# Patient Record
Sex: Female | Born: 1980 | Race: Black or African American | Hispanic: No | Marital: Single | Smoking: Never smoker
Health system: Southern US, Community
[De-identification: ages and names within clinical notes are randomized; demographics above are authoritative.]

## PROBLEM LIST (undated history)

## (undated) ENCOUNTER — Inpatient Hospital Stay (HOSPITAL_COMMUNITY): Payer: Self-pay

## (undated) DIAGNOSIS — F329 Major depressive disorder, single episode, unspecified: Secondary | ICD-10-CM

## (undated) DIAGNOSIS — F32A Depression, unspecified: Secondary | ICD-10-CM

## (undated) DIAGNOSIS — Z8742 Personal history of other diseases of the female genital tract: Secondary | ICD-10-CM

## (undated) DIAGNOSIS — D219 Benign neoplasm of connective and other soft tissue, unspecified: Secondary | ICD-10-CM

## (undated) DIAGNOSIS — Q5128 Other doubling of uterus, other specified: Secondary | ICD-10-CM

## (undated) DIAGNOSIS — Q512 Other doubling of uterus, unspecified: Secondary | ICD-10-CM

## (undated) DIAGNOSIS — R87629 Unspecified abnormal cytological findings in specimens from vagina: Secondary | ICD-10-CM

## (undated) DIAGNOSIS — K9049 Malabsorption due to intolerance, not elsewhere classified: Secondary | ICD-10-CM

## (undated) DIAGNOSIS — B009 Herpesviral infection, unspecified: Secondary | ICD-10-CM

## (undated) DIAGNOSIS — N83209 Unspecified ovarian cyst, unspecified side: Secondary | ICD-10-CM

## (undated) DIAGNOSIS — D563 Thalassemia minor: Secondary | ICD-10-CM

## (undated) DIAGNOSIS — G4700 Insomnia, unspecified: Secondary | ICD-10-CM

## (undated) HISTORY — DX: Herpesviral infection, unspecified: B00.9

## (undated) HISTORY — PX: DILATION AND CURETTAGE OF UTERUS: SHX78

## (undated) HISTORY — DX: Thalassemia minor: D56.3

---

## 2008-09-10 ENCOUNTER — Other Ambulatory Visit: Admission: RE | Admit: 2008-09-10 | Discharge: 2008-09-10 | Payer: Self-pay | Admitting: Obstetrics and Gynecology

## 2009-01-04 ENCOUNTER — Emergency Department (HOSPITAL_COMMUNITY): Admission: EM | Admit: 2009-01-04 | Discharge: 2009-01-04 | Payer: Self-pay | Admitting: Emergency Medicine

## 2009-03-29 ENCOUNTER — Emergency Department (HOSPITAL_COMMUNITY): Admission: EM | Admit: 2009-03-29 | Discharge: 2009-03-29 | Payer: Self-pay | Admitting: Family Medicine

## 2009-11-25 ENCOUNTER — Encounter: Admission: RE | Admit: 2009-11-25 | Discharge: 2009-11-25 | Payer: Self-pay | Admitting: Internal Medicine

## 2010-04-11 LAB — POCT RAPID STREP A (OFFICE): Streptococcus, Group A Screen (Direct): POSITIVE — AB

## 2011-05-25 ENCOUNTER — Other Ambulatory Visit (HOSPITAL_COMMUNITY)
Admission: RE | Admit: 2011-05-25 | Discharge: 2011-05-25 | Disposition: A | Discharge status: Home / Self Care | Payer: 59 | Source: Ambulatory Visit | Attending: Obstetrics and Gynecology | Admitting: Obstetrics and Gynecology

## 2011-05-25 ENCOUNTER — Other Ambulatory Visit: Payer: Self-pay | Admitting: Obstetrics and Gynecology

## 2011-05-25 DIAGNOSIS — N760 Acute vaginitis: Secondary | ICD-10-CM | POA: Insufficient documentation

## 2011-05-25 DIAGNOSIS — Z113 Encounter for screening for infections with a predominantly sexual mode of transmission: Secondary | ICD-10-CM | POA: Insufficient documentation

## 2011-05-25 DIAGNOSIS — Z01419 Encounter for gynecological examination (general) (routine) without abnormal findings: Secondary | ICD-10-CM | POA: Insufficient documentation

## 2011-09-30 ENCOUNTER — Emergency Department (HOSPITAL_COMMUNITY)
Admission: EM | Admit: 2011-09-30 | Discharge: 2011-09-30 | Disposition: A | Discharge status: Home / Self Care | Payer: No Typology Code available for payment source | Attending: Emergency Medicine | Admitting: Emergency Medicine

## 2011-09-30 ENCOUNTER — Encounter (HOSPITAL_COMMUNITY): Payer: Self-pay | Admitting: Emergency Medicine

## 2011-09-30 DIAGNOSIS — IMO0002 Reserved for concepts with insufficient information to code with codable children: Secondary | ICD-10-CM

## 2011-09-30 DIAGNOSIS — S139XXA Sprain of joints and ligaments of unspecified parts of neck, initial encounter: Secondary | ICD-10-CM | POA: Insufficient documentation

## 2011-09-30 DIAGNOSIS — R51 Headache: Secondary | ICD-10-CM

## 2011-09-30 DIAGNOSIS — Y998 Other external cause status: Secondary | ICD-10-CM | POA: Insufficient documentation

## 2011-09-30 DIAGNOSIS — Y93I9 Activity, other involving external motion: Secondary | ICD-10-CM | POA: Insufficient documentation

## 2011-09-30 DIAGNOSIS — Y9289 Other specified places as the place of occurrence of the external cause: Secondary | ICD-10-CM | POA: Insufficient documentation

## 2011-09-30 MED ORDER — CYCLOBENZAPRINE HCL 10 MG PO TABS: 10.0000 mg | ORAL_TABLET | Freq: Two times a day (BID) | ORAL | Status: DC | PRN

## 2011-09-30 MED ORDER — IBUPROFEN 800 MG PO TABS: 800.0000 mg | ORAL_TABLET | Freq: Three times a day (TID) | ORAL | Status: DC

## 2011-09-30 NOTE — ED Provider Notes (Signed)
Medical screening examination/treatment/procedure(s) were performed by non-physician practitioner and as supervising physician I was immediately available for consultation/collaboration.    Maryetta Shafer R Jemel Ono, MD 09/30/11 1633 
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
## 2012-05-20 ENCOUNTER — Other Ambulatory Visit: Payer: Self-pay | Admitting: Obstetrics and Gynecology

## 2012-05-20 ENCOUNTER — Other Ambulatory Visit (HOSPITAL_COMMUNITY)
Admission: RE | Admit: 2012-05-20 | Discharge: 2012-05-20 | Disposition: A | Discharge status: Home / Self Care | Payer: 59 | Source: Ambulatory Visit | Attending: Obstetrics and Gynecology | Admitting: Obstetrics and Gynecology

## 2012-05-20 DIAGNOSIS — Z01419 Encounter for gynecological examination (general) (routine) without abnormal findings: Secondary | ICD-10-CM | POA: Insufficient documentation

## 2012-05-20 DIAGNOSIS — Z1151 Encounter for screening for human papillomavirus (HPV): Secondary | ICD-10-CM | POA: Insufficient documentation

## 2012-05-20 DIAGNOSIS — Z113 Encounter for screening for infections with a predominantly sexual mode of transmission: Secondary | ICD-10-CM | POA: Insufficient documentation

## 2013-02-19 ENCOUNTER — Encounter (HOSPITAL_COMMUNITY): Payer: Self-pay

## 2013-02-19 ENCOUNTER — Inpatient Hospital Stay (HOSPITAL_COMMUNITY)
Admission: AD | Admit: 2013-02-19 | Discharge: 2013-02-19 | Disposition: A | Discharge status: Home / Self Care | Payer: Medicaid Other | Source: Ambulatory Visit | Attending: Obstetrics & Gynecology | Admitting: Obstetrics & Gynecology

## 2013-02-19 DIAGNOSIS — Z3201 Encounter for pregnancy test, result positive: Secondary | ICD-10-CM

## 2013-02-19 LAB — POCT PREGNANCY, URINE: PREG TEST UR: POSITIVE — AB

## 2013-02-19 NOTE — [facility]U Provider Note (Signed)
S: 33 y.o. G1P0 @[redacted]w[redacted]d  by LMP presents to [facility]U for pregnancy verification. She had positive pregnancy test at home.  She denies pain or vaginal bleeding today.    No current facility-administered medications on file prior to encounter.   Current Outpatient Prescriptions on File Prior to Encounter  Medication Sig Dispense Refill  . cyclobenzaprine (FLEXERIL) 10 MG tablet Take 1 tablet (10 mg total) by mouth 2 (two) times daily as needed for muscle spasms.  20 tablet  0   Past Medical History  Diagnosis Date  . Medical history non-contributory    O: BP 117/69  Pulse 90  Resp 16  Ht 5' 1.5" (1.562 m)  Wt 114 lb 3.2 oz (51.801 kg)  BMI 21.23 kg/m2  SpO2 100%  LMP 01/25/2013  Results for orders placed during the hospital encounter of 02/19/13 (from the past 168 hour(s))  POCT PREGNANCY, URINE   Collection Time    02/19/13 12:20 PM      Result Value Ref Range   Preg Test, Ur POSITIVE (*) NEGATIVE    A: Positive pregnancy test  P: D/C home Pregnancy verification letter given F/U with early prenatal care as planned   Fatima Blank Certified Nurse-Midwife

## 2013-02-19 NOTE — [facility]U Note (Signed)
Patient states she has had a positive home pregnancy test and wants confirmation. Denies pain, bleeding, discharge, nausea, vomiting or S/S of the flu.

## 2013-02-19 NOTE — Discharge Instructions (Signed)
Pregnancy - First Trimester  During sexual intercourse, millions of sperm go into the vagina. Only 1 sperm will penetrate and fertilize the female egg while it is in the Fallopian tube. One week later, the fertilized egg implants into the wall of the uterus. An embryo begins to develop into a baby. At 6 to 8 weeks, the eyes and face are formed and the heartbeat can be seen on ultrasound. At the end of 12 weeks (first trimester), all the baby's organs are formed. Now that you are pregnant, you will want to do everything you can to have a healthy baby. Two of the most important things are to get good prenatal care and follow your caregiver's instructions. Prenatal care is all the medical care you receive before the baby's birth. It is given to prevent, find, and treat problems during the pregnancy and childbirth.  PRENATAL EXAMS  · During prenatal visits, your weight, blood pressure, and urine are checked. This is done to make sure you are healthy and progressing normally during the pregnancy.  · A pregnant woman should gain 25 to 35 pounds during the pregnancy. However, if you are overweight or underweight, your caregiver will advise you regarding your weight.  · Your caregiver will ask and answer questions for you.  · Blood work, cervical cultures, other necessary tests, and a Pap test are done during your prenatal exams. These tests are done to check on your health and the probable health of your baby. Tests are strongly recommended and done for HIV with your permission. This is the virus that causes AIDS. These tests are done because medicines can be given to help prevent your baby from being born with this infection should you have been infected without knowing it. Blood work is also used to find out your blood type, previous infections, and follow your blood levels (hemoglobin).  · Low hemoglobin (anemia) is common during pregnancy. Iron and vitamins are given to help prevent this. Later in the pregnancy, blood  tests for diabetes will be done along with any other tests if any problems develop.  · You may need other tests to make sure you and the baby are doing well.  CHANGES DURING THE FIRST TRIMESTER   Your body goes through many changes during pregnancy. They vary from person to person. Talk to your caregiver about changes you notice and are concerned about. Changes can include:  · Your menstrual period stops.  · The egg and sperm carry the genes that determine what you look like. Genes from you and your partner are forming a baby. The female genes determine whether the baby is a boy or a girl.  · Your body increases in girth and you may feel bloated.  · Feeling sick to your stomach (nauseous) and throwing up (vomiting). If the vomiting is uncontrollable, call your caregiver.  · Your breasts will begin to enlarge and become tender.  · Your nipples may stick out more and become darker.  · The need to urinate more. Painful urination may mean you have a bladder infection.  · Tiring easily.  · Loss of appetite.  · Cravings for certain kinds of food.  · At first, you may gain or lose a couple of pounds.  · You may have changes in your emotions from day to day (excited to be pregnant or concerned something may go wrong with the pregnancy and baby).  · You may have more vivid and strange dreams.  HOME CARE INSTRUCTIONS   ·   It is very important to avoid all smoking, alcohol and non-prescribed drugs during your pregnancy. These affect the formation and growth of the baby. Avoid chemicals while pregnant to ensure the delivery of a healthy infant.  · Start your prenatal visits by the 12th week of pregnancy. They are usually scheduled monthly at first, then more often in the last 2 months before delivery. Keep your caregiver's appointments. Follow your caregiver's instructions regarding medicine use, blood and lab tests, exercise, and diet.  · During pregnancy, you are providing food for you and your baby. Eat regular, well-balanced  meals. Choose foods such as meat, fish, milk and other low fat dairy products, vegetables, fruits, and whole-grain breads and cereals. Your caregiver will tell you of the ideal weight gain.  · You can help morning sickness by keeping soda crackers at the bedside. Eat a couple before arising in the morning. You may want to use the crackers without salt on them.  · Eating 4 to 5 small meals rather than 3 large meals a day also may help the nausea and vomiting.  · Drinking liquids between meals instead of during meals also seems to help nausea and vomiting.  · A physical sexual relationship may be continued throughout pregnancy if there are no other problems. Problems may be early (premature) leaking of amniotic fluid from the membranes, vaginal bleeding, or belly (abdominal) pain.  · Exercise regularly if there are no restrictions. Check with your caregiver or physical therapist if you are unsure of the safety of some of your exercises. Greater weight gain will occur in the last 2 trimesters of pregnancy. Exercising will help:  · Control your weight.  · Keep you in shape.  · Prepare you for labor and delivery.  · Help you lose your pregnancy weight after you deliver your baby.  · Wear a good support or jogging bra for breast tenderness during pregnancy. This may help if worn during sleep too.  · Ask when prenatal classes are available. Begin classes when they are offered.  · Do not use hot tubs, steam rooms, or saunas.  · Wear your seat belt when driving. This protects you and your baby if you are in an accident.  · Avoid raw meat, uncooked cheese, cat litter boxes, and soil used by cats throughout the pregnancy. These carry germs that can cause birth defects in the baby.  · The first trimester is a good time to visit your dentist for your dental health. Getting your teeth cleaned is okay. Use a softer toothbrush and brush gently during pregnancy.  · Ask for help if you have financial, counseling, or nutritional needs  during pregnancy. Your caregiver will be able to offer counseling for these needs as well as refer you for other special needs.  · Do not take any medicines or herbs unless told by your caregiver.  · Inform your caregiver if there is any mental or physical domestic violence.  · Make a list of emergency phone numbers of family, friends, hospital, and police and fire departments.  · Write down your questions. Take them to your prenatal visit.  · Do not douche.  · Do not cross your legs.  · If you have to stand for long periods of time, rotate you feet or take small steps in a circle.  · You may have more vaginal secretions that may require a sanitary pad. Do not use tampons or scented sanitary pads.  MEDICINES AND DRUG USE IN PREGNANCY  ·   Take prenatal vitamins as directed. The vitamin should contain 1 milligram of folic acid. Keep all vitamins out of reach of children. Only a couple vitamins or tablets containing iron may be fatal to a baby or young child when ingested.  · Avoid use of all medicines, including herbs, over-the-counter medicines, not prescribed or suggested by your caregiver. Only take over-the-counter or prescription medicines for pain, discomfort, or fever as directed by your caregiver. Do not use aspirin, ibuprofen, or naproxen unless directed by your caregiver.  · Let your caregiver also know about herbs you may be using.  · Alcohol is related to a number of birth defects. This includes fetal alcohol syndrome. All alcohol, in any form, should be avoided completely. Smoking will cause low birth rate and premature babies.  · Street or illegal drugs are very harmful to the baby. They are absolutely forbidden. A baby born to an addicted mother will be addicted at birth. The baby will go through the same withdrawal an adult does.  · Let your caregiver know about any medicines that you have to take and for what reason you take them.  SEEK MEDICAL CARE IF:   You have any concerns or worries during your  pregnancy. It is better to call with your questions if you feel they cannot wait, rather than worry about them.  SEEK IMMEDIATE MEDICAL CARE IF:   · An unexplained oral temperature above 102° F (38.9° C) develops, or as your caregiver suggests.  · You have leaking of fluid from the vagina (birth canal). If leaking membranes are suspected, take your temperature and inform your caregiver of this when you call.  · There is vaginal spotting or bleeding. Notify your caregiver of the amount and how many pads are used.  · You develop a bad smelling vaginal discharge with a change in the color.  · You continue to feel sick to your stomach (nauseated) and have no relief from remedies suggested. You vomit blood or coffee ground-like materials.  · You lose more than 2 pounds of weight in 1 week.  · You gain more than 2 pounds of weight in 1 week and you notice swelling of your face, hands, feet, or legs.  · You gain 5 pounds or more in 1 week (even if you do not have swelling of your hands, face, legs, or feet).  · You get exposed to German measles and have never had them.  · You are exposed to fifth disease or chickenpox.  · You develop belly (abdominal) pain. Round ligament discomfort is a common non-cancerous (benign) cause of abdominal pain in pregnancy. Your caregiver still must evaluate this.  · You develop headache, fever, diarrhea, pain with urination, or shortness of breath.  · You fall or are in a car accident or have any kind of trauma.  · There is mental or physical violence in your home.  Document Released: 12/20/2000 Document Revised: 09/20/2011 Document Reviewed: 06/23/2008  [employer]® Patient Information ©2014 [employer].

## 2013-02-24 NOTE — [facility]U Provider Note (Signed)
Attestation of Attending Supervision of Advanced Practitioner (CNM/NP): Evaluation and management procedures were performed by the Advanced Practitioner under my supervision and collaboration.  I have reviewed the Advanced Practitioner's note and chart, and I agree with the management and plan.  HARRAWAY-SMITH, Avanish Cerullo 11:26 AM

## 2013-03-20 ENCOUNTER — Ambulatory Visit (INDEPENDENT_AMBULATORY_CARE_PROVIDER_SITE_OTHER): Payer: Medicaid Other | Admitting: Obstetrics

## 2013-03-20 ENCOUNTER — Encounter: Payer: Self-pay | Admitting: Obstetrics

## 2013-03-20 DIAGNOSIS — Z3400 Encounter for supervision of normal first pregnancy, unspecified trimester: Secondary | ICD-10-CM

## 2013-03-20 DIAGNOSIS — Z3201 Encounter for pregnancy test, result positive: Secondary | ICD-10-CM

## 2013-03-20 DIAGNOSIS — Z124 Encounter for screening for malignant neoplasm of cervix: Secondary | ICD-10-CM

## 2013-03-20 DIAGNOSIS — Z113 Encounter for screening for infections with a predominantly sexual mode of transmission: Secondary | ICD-10-CM

## 2013-03-20 LAB — POCT URINALYSIS DIPSTICK
Bilirubin, UA: NEGATIVE
Glucose, UA: NEGATIVE
Ketones, UA: NEGATIVE
NITRITE UA: NEGATIVE
PH UA: 8
PROTEIN UA: NEGATIVE
Spec Grav, UA: 1.015
Urobilinogen, UA: NEGATIVE

## 2013-03-20 NOTE — Progress Notes (Signed)
Subjective:    Michele Norman is being seen today for her first obstetrical visit.  This is a planned pregnancy. She is at [redacted]w[redacted]d gestation. Her obstetrical history is significant for none. Relationship with FOB: significant other, living together. Patient does intend to breast feed. Pt states that she has been having some spotting.  Was seen at [facility] and was given pregnancy results.  Pt recently in [place] and was seen by physician for this as well and labs and u/s performed.  Pt states that she was told u/s was fine. Pregnancy history fully reviewed.  Menstrual History: OB History   Grav Para Term Preterm Abortions TAB SAB Ect Mult Living   3    2 1 1          Menarche age: 87  Patient's last menstrual period was 01/25/2013.    The following portions of the patient's history were reviewed and updated as appropriate: allergies, current medications, past family history, past medical history, past social history, past surgical history and problem list.  Review of Systems Pertinent items are noted in HPI.    Objective:    General appearance: alert and no distress Abdomen: normal findings: soft, non-tender Pelvic: cervix normal in appearance, external genitalia normal, no adnexal masses or tenderness, no cervical motion tenderness, rectovaginal septum normal, uterus normal size, shape, and consistency and vagina normal without discharge Extremities: extremities normal, atraumatic, no cyanosis or edema    Assessment:    Pregnancy at [redacted]w[redacted]d weeks    Plan:    Initial labs drawn. Prenatal vitamins.  Counseling provided regarding continued use of seat belts, cessation of alcohol consumption, smoking or use of illicit drugs; infection precautions i.e., influenza/TDAP immunizations, toxoplasmosis,CMV, parvovirus, listeria and varicella; workplace safety, exercise during pregnancy; routine dental care, safe medications, sexual activity, hot tubs, saunas, pools, travel, caffeine use, fish and  methlymercury, potential toxins, hair treatments, varicose veins Weight gain recommendations per IOM guidelines reviewed: underweight/BMI< 18.5--> gain 28 - 40 lbs; normal weight/BMI 18.5 - 24.9--> gain 25 - 35 lbs; overweight/BMI 25 - 29.9--> gain 15 - 25 lbs; obese/BMI >30->gain  11 - 20 lbs Problem list reviewed and updated. FIRST/CF mutation testing/NIPT/QUAD SCREEN discussed: requested. Role of ultrasound in pregnancy discussed; fetal survey: requested. Amniocentesis discussed: not indicated. VBAC calculator score: VBAC consent form provided Follow up in 4 weeks. 50% of 20 min visit spent on counseling and coordination of care.

## 2013-03-21 LAB — HIV ANTIBODY (ROUTINE TESTING W REFLEX): HIV: NONREACTIVE

## 2013-03-21 LAB — OBSTETRIC PANEL
Antibody Screen: NEGATIVE
Basophils Absolute: 0 K/uL (ref 0.0–0.1)
Basophils Relative: 1 % (ref 0–1)
Eosinophils Absolute: 0.1 K/uL (ref 0.0–0.7)
Eosinophils Relative: 3 % (ref 0–5)
HCT: 40 % (ref 36.0–46.0)
Hemoglobin: 13.1 g/dL (ref 12.0–15.0)
Hepatitis B Surface Ag: NEGATIVE
Lymphocytes Relative: 26 % (ref 12–46)
Lymphs Abs: 1.2 K/uL (ref 0.7–4.0)
MCH: 27.8 pg (ref 26.0–34.0)
MCHC: 32.8 g/dL (ref 30.0–36.0)
MCV: 84.7 fL (ref 78.0–100.0)
Monocytes Absolute: 0.5 K/uL (ref 0.1–1.0)
Monocytes Relative: 11 % (ref 3–12)
Neutro Abs: 2.7 K/uL (ref 1.7–7.7)
Neutrophils Relative %: 59 % (ref 43–77)
Platelets: 172 K/uL (ref 150–400)
RBC: 4.72 MIL/uL (ref 3.87–5.11)
RDW: 12.9 % (ref 11.5–15.5)
Rh Type: POSITIVE
Rubella: 1.08 {index} — ABNORMAL HIGH (ref <0.90)
WBC: 4.6 K/uL (ref 4.0–10.5)

## 2013-03-21 LAB — GC/CHLAMYDIA PROBE AMP
CT Probe RNA: NEGATIVE
GC PROBE AMP APTIMA: NEGATIVE

## 2013-03-21 LAB — WET PREP BY MOLECULAR PROBE
Candida species: NEGATIVE
Gardnerella vaginalis: NEGATIVE
Trichomonas vaginosis: NEGATIVE

## 2013-03-21 LAB — VITAMIN D 25 HYDROXY (VIT D DEFICIENCY, FRACTURES): Vit D, 25-Hydroxy: 27 ng/mL — ABNORMAL LOW (ref 30–89)

## 2013-03-21 LAB — VARICELLA ZOSTER ANTIBODY, IGG: Varicella IgG: 1869 {index} — ABNORMAL HIGH (ref <135.00)

## 2013-03-22 LAB — CULTURE, OB URINE
COLONY COUNT: NO GROWTH
ORGANISM ID, BACTERIA: NO GROWTH

## 2013-03-24 LAB — HEMOGLOBINOPATHY EVALUATION
HEMOGLOBIN OTHER: 0 %
Hgb A2 Quant: 2.8 % (ref 2.2–3.2)
Hgb A: 96.7 % — ABNORMAL LOW (ref 96.8–97.8)
Hgb F Quant: 0.5 % (ref 0.0–2.0)
Hgb S Quant: 0 %

## 2013-03-24 LAB — PAP IG W/ RFLX HPV ASCU

## 2013-03-25 ENCOUNTER — Encounter (HOSPITAL_COMMUNITY): Payer: Self-pay | Admitting: *Deleted

## 2013-03-25 ENCOUNTER — Inpatient Hospital Stay (HOSPITAL_COMMUNITY)
Admission: AD | Admit: 2013-03-25 | Discharge: 2013-03-25 | Disposition: A | Discharge status: Home / Self Care | Payer: Medicaid Other | Source: Ambulatory Visit | Attending: Obstetrics | Admitting: Obstetrics

## 2013-03-25 ENCOUNTER — Inpatient Hospital Stay (HOSPITAL_COMMUNITY): Payer: Medicaid Other

## 2013-03-25 DIAGNOSIS — O034 Incomplete spontaneous abortion without complication: Secondary | ICD-10-CM

## 2013-03-25 DIAGNOSIS — O039 Complete or unspecified spontaneous abortion without complication: Secondary | ICD-10-CM

## 2013-03-25 LAB — URINE MICROSCOPIC-ADD ON

## 2013-03-25 LAB — URINALYSIS, ROUTINE W REFLEX MICROSCOPIC
BILIRUBIN URINE: NEGATIVE
Glucose, UA: NEGATIVE mg/dL
KETONES UR: NEGATIVE mg/dL
NITRITE: NEGATIVE
PH: 7.5 (ref 5.0–8.0)
Protein, ur: NEGATIVE mg/dL
SPECIFIC GRAVITY, URINE: 1.02 (ref 1.005–1.030)
UROBILINOGEN UA: 0.2 mg/dL (ref 0.0–1.0)

## 2013-03-25 LAB — HUMAN PAPILLOMAVIRUS, HIGH RISK: HPV DNA HIGH RISK: NOT DETECTED

## 2013-03-25 NOTE — MAU Provider Note (Signed)
Chief Complaint: Vaginal Bleeding   First Provider Initiated Contact with Patient 03/25/13 2219     SUBJECTIVE HPI: Michele Norman is a 33 y.o. G3P0020 at [redacted]w[redacted]d by LMP who presents with spotting.  Called [facility] and was instructed to come to maternity admissions for evaluation.States she had a normal ultrasound in [place] earlier this pregnancy. Was seen at [facility] 03/20/2013 for new OB appointment. Pelvic exam and cultures normal.  Blood type B positive.  Past Medical History  Diagnosis Date  . Medical history non-contributory    OB History  Gravida Para Term Preterm AB SAB TAB Ectopic Multiple Living  3    2 1 1    0    # Outcome Date GA Lbr Len/2nd Weight Sex Delivery Anes PTL Lv  3 CUR           2 TAB 2003          1 SAB              Past Surgical History  Procedure Laterality Date  . Wisdom tooth extraction    . Induced abortion     History   Social History  . Marital Status: Single    Spouse Name: N/A    Number of Children: N/A  . Years of Education: N/A   Occupational History  . Not on file.   Social History Main Topics  . Smoking status: Never Smoker   . Smokeless tobacco: Not on file  . Alcohol Use: No  . Drug Use: No  . Sexual Activity: Yes   Other Topics Concern  . Not on file   Social History Narrative  . No narrative on file   No current facility-administered medications on file prior to encounter.   Current Outpatient Prescriptions on File Prior to Encounter  Medication Sig Dispense Refill  . Prenatal Vit-Fe Fumarate-FA (MULTIVITAMIN-PRENATAL) 27-0.8 MG TABS tablet Take 1 tablet by mouth daily at 12 noon.       No Known Allergies  ROS: Pertinent items in HPI. Denies abdominal pain passage of tissue or urinary complaints.  OBJECTIVE Blood pressure 114/59, pulse 85, temperature 98.7 F (37.1 C), temperature source Oral, resp. rate 16, height 5\' 1"  (1.549 m), weight 51.982 kg (114 lb 9.6 oz), last menstrual period 01/25/2013, SpO2 99.00%. GENERAL:  Well-developed, well-nourished female in no acute distress.  HEENT: Normocephalic HEART: normal rate RESP: normal effort ABDOMEN: Soft, non-tender NEURO: Alert and oriented SPECULUM EXAM: Deferred due to patient distress.  LAB RESULTS Results for orders placed during the hospital encounter of 03/25/13 (from the past 24 hour(s))  URINALYSIS, ROUTINE W REFLEX MICROSCOPIC     Status: Abnormal   Collection Time    03/25/13  6:00 PM      Result Value Ref Range   Color, Urine YELLOW  YELLOW   APPearance CLEAR  CLEAR   Specific Gravity, Urine 1.020  1.005 - 1.030   pH 7.5  5.0 - 8.0   Glucose, UA NEGATIVE  NEGATIVE mg/dL   Hgb urine dipstick SMALL (*) NEGATIVE   Bilirubin Urine NEGATIVE  NEGATIVE   Ketones, ur NEGATIVE  NEGATIVE mg/dL   Protein, ur NEGATIVE  NEGATIVE mg/dL   Urobilinogen, UA 0.2  0.0 - 1.0 mg/dL   Nitrite NEGATIVE  NEGATIVE   Leukocytes, UA TRACE (*) NEGATIVE  URINE MICROSCOPIC-ADD ON     Status: Abnormal   Collection Time    03/25/13  6:00 PM      Result Value Ref Range  Squamous Epithelial / LPF FEW (*) RARE   WBC, UA 3-6  <3 WBC/hpf   RBC / HPF 3-6  <3 RBC/hpf   Bacteria, UA MANY (*) RARE   Urine-Other MUCOUS PRESENT      IMAGING US Ob Comp Less 14 Wks  03/25/2013   CLINICAL DATA:  Pain, spotting, pregnant, 8 weeks 3 days EGA by LMP ; no quantitative beta HCG for correlation  EXAM: OBSTETRIC <14 WK [place] AND TRANSVAGINAL OB US  TECHNIQUE: Both transabdominal and transvaginal ultrasound examinations were performed for complete evaluation of the gestation as well as the maternal uterus, adnexal regions, and pelvic cul-de-sac. Transvaginal technique was performed to assess early pregnancy.  COMPARISON:  None  FINDINGS: Intrauterine gestational sac: Visualized/normal in shape.  Yolk sac:  Present  Embryo:  Present  Cardiac Activity: Not identified  Heart Rate:  N/A bpm  CRL:   16.8  mm   8 w 1 d                  [place] EDC: 11/03/2013  Maternal uterus/adnexae:  No  subchorionic hemorrhage.  Right ovary normal size and morphology, 1.8 x 3.4 x 1.8 cm.  Left ovary normal size and morphology, 2.8 x 1.8 x 3.3 cm.  No adnexal masses or free pelvic fluid.  IMPRESSION: Single intrauterine gestation identified.  However no fetal cardiac activity is seen.  Findings meet definitive criteria for failed pregnancy.  This follows SRU consensus guidelines: Diagnostic Criteria for Nonviable Pregnancy Early in the First Trimester. Alison Stalling J Med [phone].   Electronically Signed   By: Lavonia Dana M.D.   On: 03/25/2013 20:53   US Ob Transvaginal  03/25/2013   CLINICAL DATA:  Pain, spotting, pregnant, 8 weeks 3 days EGA by LMP ; no quantitative beta HCG for correlation  EXAM: OBSTETRIC <14 WK [place] AND TRANSVAGINAL OB US  TECHNIQUE: Both transabdominal and transvaginal ultrasound examinations were performed for complete evaluation of the gestation as well as the maternal uterus, adnexal regions, and pelvic cul-de-sac. Transvaginal technique was performed to assess early pregnancy.  COMPARISON:  None  FINDINGS: Intrauterine gestational sac: Visualized/normal in shape.  Yolk sac:  Present  Embryo:  Present  Cardiac Activity: Not identified  Heart Rate:  N/A bpm  CRL:   16.8  mm   8 w 1 d                  [place] EDC: 11/03/2013  Maternal uterus/adnexae:  No subchorionic hemorrhage.  Right ovary normal size and morphology, 1.8 x 3.4 x 1.8 cm.  Left ovary normal size and morphology, 2.8 x 1.8 x 3.3 cm.  No adnexal masses or free pelvic fluid.  IMPRESSION: Single intrauterine gestation identified.  However no fetal cardiac activity is seen.  Findings meet definitive criteria for failed pregnancy.  This follows SRU consensus guidelines: Diagnostic Criteria for Nonviable Pregnancy Early in the First Trimester. Alison Stalling J Med [phone].   Electronically Signed   By: Lavonia Dana M.D.   On: 03/25/2013 20:53    MAU COURSE Patient extremely distraught upon hearing diagnosis of miscarriage. Declined  to speak with Chaplain. Patient's partner at bedside, supportive. Dr. Jodi Mourning informed of ultrasound results. Recommends expectant management for now. Have patient call office and morning to schedule a followup appointment.  ASSESSMENT 1. SAB (spontaneous abortion)   2. Incomplete miscarriage     PLAN Discharge home in stable condition per consult with Dr. Jodi Mourning. Discussed management options for incomplete miscarriage  including expectant management Cytotec or D&C. [facility]pa recommends expectant management for now. Patient agreeable. Bleeding and infection precautions. Urine culture pending. Follow-up Information   Call HARPER,CHARLES A, MD. (tomorrow morning to schedule follow-up appointment or , If symptoms worsen)    Specialty:  Obstetrics and Gynecology   Contact information:   [address] [place] [address] [phone]       Follow up with [facility]. (As needed in emergencies)    Contact information:   [id number] [place] [address] [phone]       Medication List         ibuprofen 600 MG tablet  Commonly known as:  ADVIL,MOTRIN  Take 1 tablet (600 mg total) by mouth every 6 (six) hours as needed for cramping.     multivitamin-prenatal 27-0.8 MG Tabs tablet  Take 1 tablet by mouth daily at 12 noon.       [place], [place] 03/25/2013  10:44 PM

## 2013-03-25 NOTE — MAU Note (Signed)
Pt states she had some vaginal bleeding and nose bleeding little over a week ago.  Was evaluated in hospital in [place].  Pt was told ultrasound and blood work was fine.  On Thurs pt work up with vaginal bleeding and nose bleeding. Pt was evaluated by Dr. Jodi Mourning.  Pelvix exam and cultures obtained.  Today about 1600 pt states she had bright red blood on tissue only with a small pea size clot.  Pt states she has not had to wear a sanitary pad.  Pt denies pain at this time.  Pt was advised to come to MAU when she called Dr. Jacelyn Grip office this afternoon.

## 2013-03-25 NOTE — Progress Notes (Signed)
Pt to BR and no blood on panties per NT [employer]

## 2013-03-25 NOTE — Discharge Instructions (Signed)
Incomplete Miscarriage A miscarriage is the sudden loss of an unborn baby (fetus) before the 20th week of pregnancy. In an incomplete miscarriage, parts of the fetus or placenta (afterbirth) remain in the body.  Having a miscarriage can be an emotional experience. Talk with your health care provider about any questions you may have about miscarrying, the grieving process, and your future pregnancy plans. CAUSES   Problems with the fetal chromosomes that make it impossible for the baby to develop normally. Problems with the baby's genes or chromosomes are most often the result of errors that occur by chance as the embryo divides and grows. The problems are not inherited from the parents.  Infection of the cervix or uterus.  Hormone problems.  Problems with the cervix, such as having an incompetent cervix. This is when the tissue in the cervix is not strong enough to hold the pregnancy.  Problems with the uterus, such as an abnormally shaped uterus, uterine fibroids, or congenital abnormalities.  Certain medical conditions.  Smoking, drinking alcohol, or taking illegal drugs.  Trauma. SYMPTOMS   Vaginal bleeding or spotting, with or without cramps or pain.  Pain or cramping in the abdomen or lower back.  Passing fluid, tissue, or blood clots from the vagina. DIAGNOSIS  Your health care provider will perform a physical exam. You may also have an ultrasound to confirm the miscarriage. Blood or urine tests may also be ordered. TREATMENT   Usually, a dilation and curettage (D&C) procedure is performed. During a D&C procedure, the cervix is widened (dilated) and any remaining fetal or placental tissue is gently removed from the uterus.  Antibiotic medicines are prescribed if there is an infection. Other medicines may be given to reduce the size of the uterus (contract) if there is a lot of bleeding.  If you have Rh negative blood and your baby was Rh positive, you will need an Rho(D)  immune globulin shot. This shot will protect any future baby from having Rh blood problems in future pregnancies.  You may be confined to bed rest. This means you should stay in bed and only get up to use the bathroom. HOME CARE INSTRUCTIONS   Rest as directed by your health care provider.  Restrict activity as directed by your health care provider. You may be allowed to continue light activity if curettage was not done but you require further treatment.  Keep track of the number of pads you use each day. Keep track of how soaked (saturated) they are. Record this information.  Do not  use tampons.  Do not douche or have sexual intercourse until approved by your health care provider.  Keep all follow-up appointments for re-evaluation and continuing management.  Only take over-the-counter or prescription medicines for pain, fever, or discomfort as directed by your health care provider.  Take antibiotic medicine as directed by your health care provider. Make sure you finish it even if you start to feel better. SEEK IMMEDIATE MEDICAL CARE IF:   You experience severe cramps in your stomach, back, or abdomen.  You have an unexplained temperature (make sure to record these temperatures).  You pass large clots or tissue (save these for your health care provider to inspect).  Your bleeding increases.  You become light-headed, weak, or have fainting episodes. MAKE SURE YOU:   Understand these instructions.  Will watch your condition.  Will get help right away if you are not doing well or get worse. Document Released: 12/26/2004 Document Revised: 10/16/2012 Document Reviewed: 07/25/2012  ExitCare Patient Information [address].  FACTS YOU SHOULD KNOW  WHAT IS AN EARLY PREGNANCY FAILURE? Once the egg is fertilized with the sperm and begins to develop, it attaches to the lining of the uterus. This early pregnancy tissue may not develop into an embryo (the beginning stage of a  baby). Sometimes an embryo does develop but does not continue to grow. These problems can be seen on ultrasound.   MANAGEMNT OF EARLY PREGNANCY FAILURE: About 4 out of 100 (0.25%) women will have a pregnancy loss in her lifetime.  One in five pregnancies is found to be an early pregnancy failure.  There are 3 ways to care for an early pregnancy failure:   (1) Surgery, (2) Medicine, (3) Waiting for you to pass the pregnancy on your own. The decision as to how to proceed after being diagnosed with and early pregnancy failure is an individual one.  The decision can be made only after appropriate counseling.  You need to weigh the pros and cons of the 3 choices. Then you can make the choice that works for you. SURGERY (D&E)   Procedure over in 1 day   Requires being put to sleep   Bleeding may be light   Possible problems during surgery, including injury to womb(uterus)   Care provider has more control Medicine (CYTOTEC)   The complete procedure may take days to weeks   No Surgery   Bleeding may be heavy at times   There may be drug side effects   Patient has more control Waiting   You may choose to wait, in which case your own body may complete the passing of the abnormal early pregnancy on its own in about 2-4 weeks   Your bleeding may be heavy at times   There is a small possibility that you may need surgery if the bleeding is too much or not all of the pregnancy has passed. CYTOTEC MANAGEMENT Prostaglandins (cytotec) are the most widely used drug for this purpose. They cause the uterus to cramp and contract. You will place the medicine yourself inside your vagina in the privacy of your home. Empting of the uterus should occur within 3 days but the process may continue for several weeks. The bleeding may seem heavy at times. POSSIBLE SIDE EFFECTS FROM CYTOTEC   Nausea   Vomiting   Diarrhea Fever   Chills  Hot Flashes Side effects  from the process of the early pregnancy failure include:     Cramping  Bleeding   Headaches  Dizziness RISKS: This is a low risk procedure. Less than 1 in 100 women has a complication. An incomplete passage of the early pregnancy may occur. Also, Hemorrhage (heavy bleeding) could happen.  Rarely the pregnancy will not be passed completely. Excessively heavy bleeding may occur.  Your doctor may need to perform surgery to empty the uterus (D&E). Afterwards: Everybody will feel differently after the early pregnancy completion. You may have soreness or cramps for a day or two. You may have soreness or cramps for day or two.  You may have light bleeding for up to 2 weeks. You may be as active as you feel like being. If you have any of the following problems you may call Maternity Admissions Unit at [phone].   If you have pain that does not get better  with pain medication   Bleeding that soaks through 2 thick full-sized sanitary pads in an hour   Cramps that last longer than 2 days  Foul smelling discharge   Fever above 100.4 degrees F Even if you do not have any of these symptoms, you should have a follow-up exam to make sure you are healing properly. This appointment will be made for you before you leave the hospital. Your next normal period will start again in 4-6 week after the loss. You can get pregnant soon after the loss, so use birth control right away. Finally: Make sure all your questions are answered before during and after any procedure. Follow up with medical care and family planning methods.

## 2013-03-25 NOTE — MAU Note (Signed)
Patient states she had some light spotting today that was bright red with a small clot. Denies pain.

## 2013-03-26 MED ORDER — IBUPROFEN 600 MG PO TABS: 600.0000 mg | ORAL_TABLET | Freq: Four times a day (QID) | ORAL | Status: DC | PRN

## 2013-03-28 ENCOUNTER — Telehealth: Payer: Self-pay | Admitting: *Deleted

## 2013-03-28 NOTE — Telephone Encounter (Signed)
Pt called in today to schedule f/u appt with Dr Jodi Mourning for Tuesday afternoon.  Pt was seen in hospital earlier this week for spotting.  U/S confirmed no FHR at [redacted] week gestation.   Pt advised of what to expect with SAB.  Pt very distraught as to what is happening.  Pt reassured that we are here to help in any way that we can.  Discussed options of cytotec and D&C.  Pt advised to call after hour nurse if she has any further questions and/or needs. Pt advised to keep f/u appt.

## 2013-04-01 ENCOUNTER — Encounter: Payer: Self-pay | Admitting: Advanced Practice Midwife

## 2013-04-01 ENCOUNTER — Ambulatory Visit (INDEPENDENT_AMBULATORY_CARE_PROVIDER_SITE_OTHER): Payer: Medicaid Other | Admitting: Obstetrics

## 2013-04-01 DIAGNOSIS — Z3400 Encounter for supervision of normal first pregnancy, unspecified trimester: Secondary | ICD-10-CM

## 2013-04-01 DIAGNOSIS — O021 Missed abortion: Secondary | ICD-10-CM

## 2013-04-01 NOTE — Progress Notes (Signed)
Pt is here today for f/u from visit to [facility] on 03/25/13.  Pt was seen for pain and spotting. Pt was told there was no longer a FHR by u/s.  Pt states that she was seen by physician in [place] when out of town the week before and everything looked good on u/s.  Pt has questions as to when and why this happened.  Pt was very distraught when phone call was taken on Friday 3/20.

## 2013-04-02 ENCOUNTER — Encounter: Payer: Self-pay | Admitting: Obstetrics

## 2013-04-02 NOTE — Progress Notes (Signed)
FHR absent with Doppler. Ultrasound ordered.

## 2013-04-03 ENCOUNTER — Other Ambulatory Visit: Payer: Medicaid Other

## 2013-04-03 ENCOUNTER — Other Ambulatory Visit: Payer: Self-pay | Admitting: Obstetrics

## 2013-04-03 DIAGNOSIS — O039 Complete or unspecified spontaneous abortion without complication: Secondary | ICD-10-CM

## 2013-04-03 DIAGNOSIS — O3680X0 Pregnancy with inconclusive fetal viability, not applicable or unspecified: Secondary | ICD-10-CM

## 2013-04-04 LAB — HCG, QUANTITATIVE, PREGNANCY: HCG, BETA CHAIN, QUANT, S: 6018.3 m[IU]/mL

## 2013-04-05 ENCOUNTER — Inpatient Hospital Stay (HOSPITAL_COMMUNITY): Payer: Medicaid Other

## 2013-04-05 ENCOUNTER — Inpatient Hospital Stay (HOSPITAL_COMMUNITY)
Admission: AD | Admit: 2013-04-05 | Discharge: 2013-04-05 | Disposition: A | Discharge status: Home / Self Care | Payer: Medicaid Other | Source: Ambulatory Visit | Attending: Obstetrics | Admitting: Obstetrics

## 2013-04-05 ENCOUNTER — Encounter (HOSPITAL_COMMUNITY): Payer: Self-pay | Admitting: *Deleted

## 2013-04-05 DIAGNOSIS — O039 Complete or unspecified spontaneous abortion without complication: Secondary | ICD-10-CM | POA: Insufficient documentation

## 2013-04-05 DIAGNOSIS — R109 Unspecified abdominal pain: Secondary | ICD-10-CM | POA: Insufficient documentation

## 2013-04-05 DIAGNOSIS — O021 Missed abortion: Secondary | ICD-10-CM

## 2013-04-05 LAB — CBC
HCT: 36.8 % (ref 36.0–46.0)
Hemoglobin: 12.2 g/dL (ref 12.0–15.0)
MCH: 27.7 pg (ref 26.0–34.0)
MCHC: 33.2 g/dL (ref 30.0–36.0)
MCV: 83.6 fL (ref 78.0–100.0)
PLATELETS: 143 10*3/uL — AB (ref 150–400)
RBC: 4.4 MIL/uL (ref 3.87–5.11)
RDW: 12.5 % (ref 11.5–15.5)
WBC: 5.3 10*3/uL (ref 4.0–10.5)

## 2013-04-05 LAB — HCG, QUANTITATIVE, PREGNANCY: HCG, BETA CHAIN, QUANT, S: 2900 m[IU]/mL — AB (ref <5)

## 2013-04-05 MED ORDER — OXYCODONE-ACETAMINOPHEN 5-325 MG PO TABS: 1.0000 | ORAL_TABLET | ORAL | Status: DC | PRN

## 2013-04-05 MED ORDER — OXYCODONE-ACETAMINOPHEN 5-325 MG PO TABS
2.0000 | ORAL_TABLET | Freq: Once | ORAL | Status: AC
  Administered 2013-04-05: 2 via ORAL
  Filled 2013-04-05: qty 2

## 2013-04-05 NOTE — MAU Note (Signed)
Pt reports pain for 3 days, but tonight is worse. Pt was diagnosed with a miscarriage on 3/17, but had no intervention at that time.

## 2013-04-05 NOTE — MAU Provider Note (Signed)
History     CSN: [id number]  Arrival date and time: 04/05/13 4970   First Provider Initiated Contact with Patient 04/05/13 0345      Chief Complaint  Patient presents with  . Abdominal Pain   HPI  [name] is a 33 y.o. female G3P0020 who presents with increased abdominal pain. Pt was seen here in MAU on 3/17 and was told she had a failed pregnancy; she was given ibuprofen for pain after deciding to do expectant management.  She went to Dr. Jacelyn Grip office this past Tuesday for a follow up. Pt discussed the miscarriage with Dr. Jodi Mourning including why this happened. She went back to the office on Thursday for blood work. She was told to follow up in MAU on Saturday morning for repeat blood work and the patient was told she would have an [place] in the office in the next week to confirm fetal demise. The patient came to MAU today thinking that she was going to have a D/C today.   OB History   Grav Para Term Preterm Abortions TAB SAB Ect Mult Living   3    2 1 1    0      Past Medical History  Diagnosis Date  . Medical history non-contributory     Past Surgical History  Procedure Laterality Date  . Wisdom tooth extraction    . Induced abortion      Family History  Problem Relation Age of Onset  . Thyroid disease Father   . Heart murmur Brother   . Sickle cell anemia Brother   . Stroke Maternal Grandmother     History  Substance Use Topics  . Smoking status: Never Smoker   . Smokeless tobacco: Not on file  . Alcohol Use: No    Allergies: No Known Allergies  Prescriptions prior to admission  Medication Sig Dispense Refill  . ibuprofen (ADVIL,MOTRIN) 600 MG tablet Take 1 tablet (600 mg total) by mouth every 6 (six) hours as needed for cramping.  30 tablet  1  . Prenatal Vit-Fe Fumarate-FA (MULTIVITAMIN-PRENATAL) 27-0.8 MG TABS tablet Take 1 tablet by mouth daily at 12 noon.       Results for orders placed during the hospital encounter of 04/05/13 (from the past 48  hour(s))  CBC     Status: Abnormal   Collection Time    04/05/13  4:05 AM      Result Value Ref Range   WBC 5.3  4.0 - 10.5 K/uL   RBC 4.40  3.87 - 5.11 MIL/uL   Hemoglobin 12.2  12.0 - 15.0 g/dL   HCT 36.8  36.0 - 46.0 %   MCV 83.6  78.0 - 100.0 fL   MCH 27.7  26.0 - 34.0 pg   MCHC 33.2  30.0 - 36.0 g/dL   RDW 12.5  11.5 - 15.5 %   Platelets 143 (*) 150 - 400 K/uL  HCG, QUANTITATIVE, PREGNANCY     Status: Abnormal   Collection Time    04/05/13  4:05 AM      Result Value Ref Range   hCG, Beta Chain, Quant, S 2900 (*) <5 mIU/mL   Comment:              GEST. AGE      CONC.  (mIU/mL)       <=1 WEEK        5 - 50         2 WEEKS  50 - 500         3 WEEKS       100 - 10,000         4 WEEKS     1,000 - 30,000         5 WEEKS     3,500 - 115,000       6-8 WEEKS     12,000 - 270,000        12 WEEKS     15,000 - 220,000                FEMALE AND NON-PREGNANT FEMALE:         LESS THAN 5 mIU/mL   US Ob Transvaginal  04/05/2013   CLINICAL DATA:  Viability scan.  Spontaneous abortion in progress.  EXAM: TRANSVAGINAL OB ULTRASOUND  TECHNIQUE: Transvaginal ultrasound was performed for complete evaluation of the gestation as well as the maternal uterus, adnexal regions, and pelvic cul-de-sac.  COMPARISON:  03/25/2013.  FINDINGS: Interval evacuation of the previously seen gestational sac and fetus. There is distension of the endometrial cavity in the lower uterine segment to 15 mm AP span. There is no vascularity of this tissue/material. The ovaries are symmetric in size and normal in appearance. There is trace free pelvic fluid.  IMPRESSION: Since 03/25/2013, evacuation of nonviable gestational sac and fetus. There is hematoma or nonvascular products of conception distending the lower uterine segment.   Electronically Signed   By: Jorje Guild M.D.   On: 04/05/2013 05:36    Review of Systems  Constitutional: Negative for fever and chills.  Gastrointestinal: Positive for abdominal pain  (+bilateral lower abdominal cramping ). Negative for nausea, vomiting, diarrhea and constipation.  Genitourinary: Negative for dysuria, urgency, frequency and hematuria.       No vaginal discharge. + vaginal bleeding; dark red, light No dysuria.     Physical Exam   Blood pressure 109/62, pulse 63, temperature 98.5 F (36.9 C), temperature source Oral, resp. rate 18, last menstrual period 01/25/2013.  Physical Exam  Constitutional: She appears well-developed and well-nourished. No distress.  Respiratory: Effort normal.  GI: Soft. She exhibits no distension. There is no tenderness.  Genitourinary:  Speculum exam: Vagina - Small amount of dark red blood in canal Cervix - small active bleeding, clot noted at os; unable to retreive.  Bimanual exam: Cervix FT Uterus non tender, enlarged  Adnexa non tender, no masses bilaterally Chaperone present for exam.   Skin: She is not diaphoretic.    MAU Course  Procedures None  MDM B positive blood type  Pt currently rates her pain 9-10 2 percocet given in MAU  Patient rates her pain 2/10 following percocet. Pt verbalizes that she feels better going home knowing her pain will be controlled.  Consulted with Dr. Oletha Cruel; discharge patient home with pain medication and have patient follow up in the office on Monday with Dr. Jodi Mourning.   Assessment and Plan   A:  SAB in progress Hematoma or nonvascular products of conception distending the lower uterine segment   P:  Discharge home in stable condition Call Dr. Jacelyn Grip office on Monday to schedule a follow up appointment Bleeding precautions discussed RX: Percocet Continue taking ibuprofen as prescribed Support given  Darrelyn Hillock Becky Berberian, NP 04/05/2013, 3:46 PM

## 2013-04-15 ENCOUNTER — Encounter: Payer: Medicaid Other | Admitting: Obstetrics

## 2013-04-15 ENCOUNTER — Ambulatory Visit (INDEPENDENT_AMBULATORY_CARE_PROVIDER_SITE_OTHER): Payer: Medicaid Other | Admitting: Obstetrics

## 2013-04-15 ENCOUNTER — Other Ambulatory Visit: Payer: Self-pay | Admitting: Obstetrics

## 2013-04-15 DIAGNOSIS — O021 Missed abortion: Secondary | ICD-10-CM | POA: Insufficient documentation

## 2013-04-15 LAB — CBC
HCT: 39.9 % (ref 36.0–46.0)
Hemoglobin: 13.1 g/dL (ref 12.0–15.0)
MCH: 27.3 pg (ref 26.0–34.0)
MCHC: 32.8 g/dL (ref 30.0–36.0)
MCV: 83.1 fL (ref 78.0–100.0)
Platelets: 161 10*3/uL (ref 150–400)
RBC: 4.8 MIL/uL (ref 3.87–5.11)
RDW: 13 % (ref 11.5–15.5)
WBC: 3.3 10*3/uL — ABNORMAL LOW (ref 4.0–10.5)

## 2013-04-16 ENCOUNTER — Encounter: Payer: Self-pay | Admitting: Obstetrics

## 2013-04-16 ENCOUNTER — Ambulatory Visit (HOSPITAL_COMMUNITY)
Admission: RE | Admit: 2013-04-16 | Discharge: 2013-04-16 | Disposition: A | Discharge status: Home / Self Care | Payer: Medicaid Other | Source: Ambulatory Visit | Attending: Obstetrics | Admitting: Obstetrics

## 2013-04-16 ENCOUNTER — Ambulatory Visit (INDEPENDENT_AMBULATORY_CARE_PROVIDER_SITE_OTHER): Payer: Medicaid Other | Admitting: Obstetrics

## 2013-04-16 ENCOUNTER — Inpatient Hospital Stay (HOSPITAL_COMMUNITY)
Admission: AD | Admit: 2013-04-16 | Discharge: 2013-04-17 | Disposition: A | Discharge status: Home / Self Care | Payer: Medicaid Other | Source: Ambulatory Visit | Attending: Obstetrics | Admitting: Obstetrics

## 2013-04-16 ENCOUNTER — Encounter (HOSPITAL_COMMUNITY): Payer: Self-pay | Admitting: *Deleted

## 2013-04-16 VITALS — BP 102/67 | HR 83 | Temp 98.6°F | Ht 61.5 in | Wt 113.0 lb

## 2013-04-16 DIAGNOSIS — N898 Other specified noninflammatory disorders of vagina: Secondary | ICD-10-CM | POA: Insufficient documentation

## 2013-04-16 DIAGNOSIS — R509 Fever, unspecified: Secondary | ICD-10-CM

## 2013-04-16 DIAGNOSIS — F329 Major depressive disorder, single episode, unspecified: Secondary | ICD-10-CM

## 2013-04-16 DIAGNOSIS — F3289 Other specified depressive episodes: Secondary | ICD-10-CM

## 2013-04-16 DIAGNOSIS — Z09 Encounter for follow-up examination after completed treatment for conditions other than malignant neoplasm: Secondary | ICD-10-CM | POA: Insufficient documentation

## 2013-04-16 DIAGNOSIS — O021 Missed abortion: Secondary | ICD-10-CM

## 2013-04-16 DIAGNOSIS — J101 Influenza due to other identified influenza virus with other respiratory manifestations: Secondary | ICD-10-CM

## 2013-04-16 DIAGNOSIS — R109 Unspecified abdominal pain: Secondary | ICD-10-CM | POA: Insufficient documentation

## 2013-04-16 DIAGNOSIS — J111 Influenza due to unidentified influenza virus with other respiratory manifestations: Secondary | ICD-10-CM

## 2013-04-16 DIAGNOSIS — R52 Pain, unspecified: Secondary | ICD-10-CM | POA: Insufficient documentation

## 2013-04-16 LAB — COMPREHENSIVE METABOLIC PANEL
ALBUMIN: 4 g/dL (ref 3.5–5.2)
ALK PHOS: 21 U/L — AB (ref 39–117)
ALT: 11 U/L (ref 0–35)
AST: 11 U/L (ref 0–37)
BUN: 9 mg/dL (ref 6–23)
CHLORIDE: 103 meq/L (ref 96–112)
CO2: 29 mEq/L (ref 19–32)
Calcium: 9.1 mg/dL (ref 8.4–10.5)
Creat: 0.74 mg/dL (ref 0.50–1.10)
GLUCOSE: 159 mg/dL — AB (ref 70–99)
POTASSIUM: 3.4 meq/L — AB (ref 3.5–5.3)
SODIUM: 139 meq/L (ref 135–145)
TOTAL PROTEIN: 6.3 g/dL (ref 6.0–8.3)
Total Bilirubin: 0.7 mg/dL (ref 0.2–1.2)

## 2013-04-16 LAB — URINALYSIS, ROUTINE W REFLEX MICROSCOPIC
Bilirubin Urine: NEGATIVE
GLUCOSE, UA: NEGATIVE mg/dL
KETONES UR: NEGATIVE mg/dL
Leukocytes, UA: NEGATIVE
Nitrite: NEGATIVE
PROTEIN: NEGATIVE mg/dL
Specific Gravity, Urine: 1.02 (ref 1.005–1.030)
Urobilinogen, UA: 0.2 mg/dL (ref 0.0–1.0)
pH: 8 (ref 5.0–8.0)

## 2013-04-16 LAB — HCG, QUANTITATIVE, PREGNANCY: hCG, Beta Chain, Quant, S: 49.8 m[IU]/mL

## 2013-04-16 LAB — URINE MICROSCOPIC-ADD ON

## 2013-04-16 MED ORDER — LACTATED RINGERS IV BOLUS (SEPSIS)
1000.0000 mL | Freq: Once | INTRAVENOUS | Status: AC
  Administered 2013-04-16: 1000 mL via INTRAVENOUS

## 2013-04-16 MED ORDER — IBUPROFEN 800 MG PO TABS
800.0000 mg | ORAL_TABLET | Freq: Once | ORAL | Status: AC
  Administered 2013-04-16: 800 mg via ORAL
  Filled 2013-04-16: qty 1

## 2013-04-16 MED ORDER — SERTRALINE HCL 50 MG PO TABS: 50.0000 mg | ORAL_TABLET | Freq: Every day | ORAL | Status: DC

## 2013-04-16 MED ORDER — BUTORPHANOL TARTRATE 1 MG/ML IJ SOLN
1.0000 mg | Freq: Once | INTRAMUSCULAR | Status: AC
Start: 2013-04-16 — End: 2013-04-16
  Administered 2013-04-16: 1 mg via INTRAVENOUS
  Filled 2013-04-16: qty 1

## 2013-04-16 NOTE — MAU Provider Note (Signed)
History     CSN: [id number]  Arrival date and time: 04/16/13 2022   First Provider Initiated Contact with Patient 04/16/13 2126      Chief Complaint  Patient presents with  . Generalized Body Aches  . Fever   HPI [name] is a 33 y.o. G3P0020 who recently have a miscarriage who presents to MAU today with complaint of new onset fever and body aches. The patient was seen in the office today and US showed no retained POC. The patient spiked a fever around 2000 today of 102.2 F. She did not take any medications, but came straight here. She continues to have light bleeding. She denies specific abdominal pain, just stating generalized body aches.   OB History   Grav Para Term Preterm Abortions TAB SAB Ect Mult Living   3    2 1 1    0      Past Medical History  Diagnosis Date  . Medical history non-contributory     Past Surgical History  Procedure Laterality Date  . Wisdom tooth extraction    . Induced abortion      Family History  Problem Relation Age of Onset  . Thyroid disease Father   . Heart murmur Brother   . Sickle cell anemia Brother   . Stroke Maternal Grandmother     History  Substance Use Topics  . Smoking status: Never Smoker   . Smokeless tobacco: Not on file  . Alcohol Use: Yes     Comment:  wine occasionally     Allergies: No Known Allergies  No prescriptions prior to admission    Review of Systems  Constitutional: Positive for fever and malaise/fatigue.  Gastrointestinal: Positive for abdominal pain. Negative for nausea and vomiting.  Genitourinary: Negative for dysuria, urgency and frequency.       + vaginal bleeding   Physical Exam   Blood pressure 90/38, pulse 112, temperature 99.8 F (37.7 C), temperature source Oral, resp. rate 16, last menstrual period 01/25/2013, SpO2 100.00%, unknown if currently breastfeeding.  Physical Exam  Constitutional: She is oriented to person, place, and time. She appears well-developed and  well-nourished. No distress.  HENT:  Head: Normocephalic and atraumatic.  Cardiovascular: Regular rhythm and normal heart sounds.  Tachycardia present.   Respiratory: Effort normal and breath sounds normal. No respiratory distress.  GI: Soft. She exhibits no distension and no mass. There is no tenderness. There is no rebound, no guarding and no CVA tenderness.  Genitourinary: Uterus is not enlarged and not tender. Cervix exhibits no motion tenderness, no discharge and no friability. Right adnexum displays no mass and no tenderness. Left adnexum displays no mass and no tenderness. There is bleeding (small amount of blood and one medium sized clot noted) around the vagina. No vaginal discharge found.  Neurological: She is alert and oriented to person, place, and time.  Skin: Skin is warm and dry. No erythema.  Psychiatric: She has a normal mood and affect.   Results for orders placed during the hospital encounter of 04/16/13 (from the past 24 hour(s))  URINALYSIS, ROUTINE W REFLEX MICROSCOPIC     Status: Abnormal   Collection Time    04/16/13  8:45 PM      Result Value Ref Range   Color, Urine YELLOW  YELLOW   APPearance CLEAR  CLEAR   Specific Gravity, Urine 1.020  1.005 - 1.030   pH 8.0  5.0 - 8.0   Glucose, UA NEGATIVE  NEGATIVE mg/dL  Hgb urine dipstick LARGE (*) NEGATIVE   Bilirubin Urine NEGATIVE  NEGATIVE   Ketones, ur NEGATIVE  NEGATIVE mg/dL   Protein, ur NEGATIVE  NEGATIVE mg/dL   Urobilinogen, UA 0.2  0.0 - 1.0 mg/dL   Nitrite NEGATIVE  NEGATIVE   Leukocytes, UA NEGATIVE  NEGATIVE  URINE MICROSCOPIC-ADD ON     Status: Abnormal   Collection Time    04/16/13  8:45 PM      Result Value Ref Range   Squamous Epithelial / LPF FEW (*) RARE   WBC, UA 0-2  <3 WBC/hpf   RBC / HPF 3-6  <3 RBC/hpf   Bacteria, UA RARE  RARE    MAU Course  Procedures None  MDM Discussed with Dr. Jodi Mourning. Blood cultures, flu swab, urine culture, 1 mg stadol, 1 liter LR and Ibuprofen  Patient  reports resolution of pain Temperature is now 99.4 F Continues to have no pain or fever after monitoring x > 1 hour Cultures pending Discussed with Dr. Jodi Mourning. [place] for discharge.   Assessment and Plan  A: Fever of unknown origin  P: Discharge home Ibuprofen and Percocet advised for pain and fever PRN Blood cultures, urine culture and influenza swab pending Follow-up with Jodi Mourning as scheduled or call for earlier appointment if symptoms persist or worsen Patient may return to MAU as needed or if her condition were to change or worsen  Farris Has, PA-C  04/17/2013, 4:57 AM

## 2013-04-16 NOTE — MAU Note (Signed)
Pt reports she was seen in MD's office today for follow up of failed pregnancy. Was told that she had passed the pregnancy and there was just some clot right at the cervix. Pt reports that about 1800 she started having body aches and back pain and then started running a fever. States her whole back hurts. Changed her pad twice today.

## 2013-04-16 NOTE — Progress Notes (Signed)
Subjective:     Michele Norman is a 33 y.o. female here for a routine exam.  Current complaints: Patient is in the office today for a follow up visit for ultrasound results.  The HPI was reviewed and explored in further detail by the provider. Gynecologic History Patient's last menstrual period was 01/25/2013. Contraception: abstinence Obstetric History OB History  Gravida Para Term Preterm AB SAB TAB Ectopic Multiple Living  3    2 1 1    0    # Outcome Date GA Lbr Len/2nd Weight Sex Delivery Anes PTL Lv  3 CUR           2 TAB 2003          1 SAB                The following portions of the patient's history were reviewed and updated as appropriate: allergies, current medications, past family history, past medical history, past social history, past surgical history and problem list.  Review of Systems A comprehensive review of systems was negative.    Objective:    No exam performed today, Consult only..    100 % of 10 min visit spent on counseling and coordination of care.   Assessment:    Missed Abortion.  No IUP seen on ultrasound.  Findings on ultrasound explained to patient.  Depression, situational.  Counseling done for future contraception. Plan:    Education reviewed: Pregnancy loss and management.. Follow Quantitative beta HCG's.   Zoloft Rx for Depression.

## 2013-04-17 DIAGNOSIS — R509 Fever, unspecified: Secondary | ICD-10-CM

## 2013-04-17 LAB — INFLUENZA PANEL BY PCR (TYPE A & B)
H1N1 flu by pcr: NOT DETECTED
Influenza A By PCR: POSITIVE — AB
Influenza B By PCR: NEGATIVE

## 2013-04-17 MED ORDER — OSELTAMIVIR PHOSPHATE 75 MG PO CAPS: 75.0000 mg | ORAL_CAPSULE | Freq: Two times a day (BID) | ORAL | Status: DC

## 2013-04-17 NOTE — Discharge Instructions (Signed)
Miscarriage  A miscarriage is the loss of an unborn baby (fetus) before the 20th week of pregnancy. The cause is often unknown.   HOME CARE  · You may need to stay in bed (bed rest), or you may be able to do light activity. Go about activity as told by your doctor.  · Have help at home.  · Write down how many pads you use each day. Write down how soaked they are.  · Do not use tampons. Do not wash out your vagina (douche) or have sex (intercourse) until your doctor approves.  · Only take medicine as told by your doctor.  · Do not take aspirin.  · Keep all doctor visits as told.  · If you or your partner have problems with grieving, talk to your doctor. You can also try counseling. Give yourself time to grieve before trying to get pregnant again.  GET HELP RIGHT AWAY IF:  · You have bad cramps or pain in your back or belly (abdomen).  · You have a fever.  · You pass large clumps of blood (clots) from your vagina that are walnut-sized or larger. Save the clumps for your doctor to see.  · You pass large amounts of tissue from your vagina. Save the tissue for your doctor to see.  · You have more bleeding.  · You have thick, bad-smelling fluid (discharge) coming from the vagina.  · You get lightheaded, weak, or you pass out (faint).  · You have chills.  MAKE SURE YOU:  · Understand these instructions.  · Will watch your condition.  · Will get help right away if you are not doing well or get worse.  Document Released: 03/20/2011 Document Reviewed: 03/20/2011  [employer]® Patient Information ©2014 [employer].

## 2013-04-18 ENCOUNTER — Other Ambulatory Visit: Payer: Self-pay | Admitting: Medical

## 2013-04-18 ENCOUNTER — Encounter: Payer: Self-pay | Admitting: Obstetrics

## 2013-04-18 LAB — URINE CULTURE
Colony Count: NO GROWTH
Culture: NO GROWTH

## 2013-04-18 NOTE — Progress Notes (Signed)
LM for patient to return call to MAU. Patient had + influenza. Rx for Tamiflu sent to patient's pharmacy by Dr. Jodi Mourning on 04/17/13.   Farris Has, PA-C 04/18/2013 8:26 AM

## 2013-04-22 ENCOUNTER — Ambulatory Visit: Payer: Medicaid Other | Admitting: Obstetrics

## 2013-04-23 LAB — CULTURE, BLOOD (ROUTINE X 2)
CULTURE: NO GROWTH
Culture: NO GROWTH

## 2013-04-28 ENCOUNTER — Encounter: Payer: Self-pay | Admitting: Obstetrics

## 2013-04-28 DIAGNOSIS — F3289 Other specified depressive episodes: Secondary | ICD-10-CM | POA: Insufficient documentation

## 2013-04-28 DIAGNOSIS — F329 Major depressive disorder, single episode, unspecified: Secondary | ICD-10-CM | POA: Insufficient documentation

## 2013-04-28 DIAGNOSIS — J101 Influenza due to other identified influenza virus with other respiratory manifestations: Secondary | ICD-10-CM | POA: Insufficient documentation

## 2013-04-29 ENCOUNTER — Encounter: Payer: Self-pay | Admitting: Obstetrics

## 2013-04-29 NOTE — Progress Notes (Signed)
Patient ID: Michele Norman, female   DOB: 1980-03-11, 33 y.o.   MRN: [id number]  No chief complaint on file.   HPI Michele Norman is a 33 y.o. female.  Presents for results of ultrasound for suspected SAB.  HPI  Past Medical History  Diagnosis Date  . Medical history non-contributory     Past Surgical History  Procedure Laterality Date  . Wisdom tooth extraction    . Induced abortion      Family History  Problem Relation Age of Onset  . Thyroid disease Father   . Heart murmur Brother   . Sickle cell anemia Brother   . Stroke Maternal Grandmother     Social History History  Substance Use Topics  . Smoking status: Never Smoker   . Smokeless tobacco: Not on file  . Alcohol Use: Yes     Comment:  wine occasionally     No Known Allergies  Current Outpatient Prescriptions  Medication Sig Dispense Refill  . ibuprofen (ADVIL,MOTRIN) 600 MG tablet Take 1 tablet (600 mg total) by mouth every 6 (six) hours as needed for cramping.  30 tablet  1  . oseltamivir (TAMIFLU) 75 MG capsule Take 1 capsule (75 mg total) by mouth 2 (two) times daily.  10 capsule  1  . oxyCODONE-acetaminophen (PERCOCET/ROXICET) 5-325 MG per tablet Take 1-2 tablets by mouth every 4 (four) hours as needed for severe pain.  15 tablet  0  . sertraline (ZOLOFT) 50 MG tablet Take 1 tablet (50 mg total) by mouth daily.  30 tablet  11   No current facility-administered medications for this visit.    Review of Systems Review of Systems Constitutional: negative for fatigue and weight loss Respiratory: negative for cough and wheezing Cardiovascular: negative for chest pain, fatigue and palpitations Gastrointestinal: negative for abdominal pain and change in bowel habits Genitourinary:negative Integument/breast: negative for nipple discharge Musculoskeletal:negative for myalgias Neurological: negative for gait problems and tremors Behavioral/Psych: negative for abusive relationship, depression Endocrine: negative for  temperature intolerance     Last menstrual period 01/25/2013, unknown if currently breastfeeding.  Physical Exam Physical Exam:  Deferred                       Data Reviewed Ultrasound reveals no IUP.  Assessment    Missed abortion.  Complete.  Follow  Quantitative beta HCG     Plan      Need to obtain previous records Possible management options include:Expectant.  Will follow  Follow up as needed.       [name] 04/29/2013, 9:06 AM

## 2013-05-01 ENCOUNTER — Ambulatory Visit (INDEPENDENT_AMBULATORY_CARE_PROVIDER_SITE_OTHER): Payer: Medicaid Other | Admitting: Obstetrics

## 2013-05-01 ENCOUNTER — Encounter: Payer: Self-pay | Admitting: Obstetrics

## 2013-05-01 ENCOUNTER — Encounter: Payer: Self-pay | Admitting: *Deleted

## 2013-05-01 VITALS — BP 103/74 | HR 76 | Temp 99.1°F | Ht 61.5 in | Wt 115.0 lb

## 2013-05-01 DIAGNOSIS — F329 Major depressive disorder, single episode, unspecified: Secondary | ICD-10-CM

## 2013-05-01 DIAGNOSIS — O021 Missed abortion: Secondary | ICD-10-CM

## 2013-05-01 DIAGNOSIS — F3289 Other specified depressive episodes: Secondary | ICD-10-CM

## 2013-05-02 ENCOUNTER — Encounter: Payer: Self-pay | Admitting: Obstetrics

## 2013-05-02 LAB — HCG, QUANTITATIVE, PREGNANCY: hCG, Beta Chain, Quant, S: <2 m[IU]/mL

## 2013-05-02 NOTE — Progress Notes (Signed)
Patient ID: Michele Norman, female   DOB: 08/07/1980, 33 y.o.   MRN: [id number]  Chief Complaint  Patient presents with  . Follow-up    Miscarriage    HPI Michele Norman is a 33 y.o. female.  Presents for lab results after Missed Abortion, completed.  No complaints.  HPI  Past Medical History  Diagnosis Date  . Medical history non-contributory     Past Surgical History  Procedure Laterality Date  . Wisdom tooth extraction    . Induced abortion      Family History  Problem Relation Age of Onset  . Thyroid disease Father   . Heart murmur Brother   . Sickle cell anemia Brother   . Stroke Maternal Grandmother     Social History History  Substance Use Topics  . Smoking status: Never Smoker   . Smokeless tobacco: Not on file  . Alcohol Use: Yes     Comment:  wine occasionally     No Known Allergies  Current Outpatient Prescriptions  Medication Sig Dispense Refill  . sertraline (ZOLOFT) 50 MG tablet Take 1 tablet (50 mg total) by mouth daily.  30 tablet  11   No current facility-administered medications for this visit.    Review of Systems Review of Systems Constitutional: negative for fatigue and weight loss Respiratory: negative for cough and wheezing Cardiovascular: negative for chest pain, fatigue and palpitations Gastrointestinal: negative for abdominal pain and change in bowel habits Genitourinary:negative Integument/breast: negative for nipple discharge Musculoskeletal:negative for myalgias Neurological: negative for gait problems and tremors Behavioral/Psych: positive for depression Endocrine: negative for temperature intolerance     Blood pressure 103/74, pulse 76, temperature 99.1 F (37.3 C), height 5' 1.5" (1.562 m), weight 115 lb (52.164 kg), last menstrual period 01/25/2013, unknown if currently breastfeeding.  Physical Exam Physical Exam General:   alert PE:  Deferred.  Consult only.  Data Reviewed Ultrasound reveals no IUP.  Quantitative beta HCG  down to 49 from 6000.  Assessment    Missed Abortion  Depression.    Plan      Need to obtain previous records Possible management options include:Repeat quantitative beta HCG until < 25. Follow up as needed.  Continue Zoloft for depression.  F/U in 6 weeks.        Michele Norman 05/02/2013, 1:12 AM

## 2013-06-12 ENCOUNTER — Ambulatory Visit (INDEPENDENT_AMBULATORY_CARE_PROVIDER_SITE_OTHER): Payer: Medicaid Other | Admitting: Obstetrics

## 2013-06-12 ENCOUNTER — Encounter: Payer: Self-pay | Admitting: Obstetrics

## 2013-06-12 VITALS — BP 99/64 | HR 80 | Temp 98.6°F | Ht 62.0 in | Wt 115.0 lb

## 2013-06-12 DIAGNOSIS — O039 Complete or unspecified spontaneous abortion without complication: Secondary | ICD-10-CM

## 2013-06-12 DIAGNOSIS — J309 Allergic rhinitis, unspecified: Secondary | ICD-10-CM

## 2013-06-12 DIAGNOSIS — J302 Other seasonal allergic rhinitis: Secondary | ICD-10-CM | POA: Insufficient documentation

## 2013-06-12 DIAGNOSIS — F329 Major depressive disorder, single episode, unspecified: Secondary | ICD-10-CM

## 2013-06-12 DIAGNOSIS — F3289 Other specified depressive episodes: Secondary | ICD-10-CM

## 2013-06-12 DIAGNOSIS — O021 Missed abortion: Secondary | ICD-10-CM

## 2013-06-12 DIAGNOSIS — G4700 Insomnia, unspecified: Secondary | ICD-10-CM

## 2013-06-12 MED ORDER — LORATADINE 10 MG PO TABS: 10.0000 mg | ORAL_TABLET | Freq: Every day | ORAL | Status: DC

## 2013-06-12 MED ORDER — ZOLPIDEM TARTRATE 5 MG PO TABS: 5.0000 mg | ORAL_TABLET | Freq: Every evening | ORAL | Status: DC | PRN

## 2013-06-12 NOTE — Progress Notes (Signed)
Patient ID: Michele Norman, female   DOB: 27-Jul-1980, 33 y.o.   MRN: [id number]  Chief Complaint  Patient presents with  . Follow-up    Depression     HPI Michele Norman is a 33 y.o. female.  S/P SAB in March 2015.  Depressed.  On Zoloft but stopped recently.  Also c/o insomnia.Runny nose and congestion started yesterday.  HPI  Past Medical History  Diagnosis Date  . Medical history non-contributory     Past Surgical History  Procedure Laterality Date  . Wisdom tooth extraction    . Induced abortion      Family History  Problem Relation Age of Onset  . Thyroid disease Father   . Heart murmur Brother   . Sickle cell anemia Brother   . Stroke Maternal Grandmother     Social History History  Substance Use Topics  . Smoking status: Never Smoker   . Smokeless tobacco: Not on file  . Alcohol Use: Yes     Comment:  wine occasionally     No Known Allergies  Current Outpatient Prescriptions  Medication Sig Dispense Refill  . loratadine (CLARITIN) 10 MG tablet Take 1 tablet (10 mg total) by mouth daily.  30 tablet  11  . sertraline (ZOLOFT) 50 MG tablet Take 1 tablet (50 mg total) by mouth daily.  30 tablet  11  . zolpidem (AMBIEN) 5 MG tablet Take 1 tablet (5 mg total) by mouth at bedtime as needed for sleep.  30 tablet  5   No current facility-administered medications for this visit.    Review of Systems Review of Systems Constitutional: negative for fatigue and weight loss Respiratory: negative for cough and wheezing Cardiovascular: negative for chest pain, fatigue and palpitations Gastrointestinal: negative for abdominal pain and change in bowel habits Genitourinary:negative Integument/breast: negative for nipple discharge Musculoskeletal:negative for myalgias Neurological: negative for gait problems and tremors Behavioral/Psych: negative for abusive relationship, depression Endocrine: negative for temperature intolerance     Blood pressure 99/64, pulse 80,  temperature 98.6 F (37 C), height 5\' 2"  (1.575 m), weight 115 lb (52.164 kg), last menstrual period 06/05/2013, unknown if currently breastfeeding.  Physical Exam Physical Exam:  Deferred  100% of 10 min visit spent on counseling and coordination of care.   Data Reviewed Labs Ultrasound  Assessment    SAB, completed.  Doing well. Depression.  Situational.  Unstable.   Insomnia. Seasonal allergic rhinitis.    Plan    Continue Zoloft. Ambien Rx Claritin Rx. F/U 4 months. No orders of the defined types were placed in this encounter.   Meds ordered this encounter  Medications  . loratadine (CLARITIN) 10 MG tablet    Sig: Take 1 tablet (10 mg total) by mouth daily.    Dispense:  30 tablet    Refill:  11  . DISCONTD: zolpidem (AMBIEN) 5 MG tablet    Sig: Take 1 tablet (5 mg total) by mouth at bedtime as needed for sleep.    Dispense:  30 tablet    Refill:  5  . zolpidem (AMBIEN) 5 MG tablet    Sig: Take 1 tablet (5 mg total) by mouth at bedtime as needed for sleep.    Dispense:  30 tablet    Refill:  5      Charles Simona Huh 06/12/2013, 12:55 PM

## 2013-06-18 ENCOUNTER — Inpatient Hospital Stay (HOSPITAL_COMMUNITY): Payer: Medicaid Other

## 2013-06-18 ENCOUNTER — Inpatient Hospital Stay (HOSPITAL_COMMUNITY)
Admission: AD | Admit: 2013-06-18 | Discharge: 2013-06-18 | Disposition: A | Discharge status: Home / Self Care | Payer: Medicaid Other | Source: Ambulatory Visit | Attending: Obstetrics | Admitting: Obstetrics

## 2013-06-18 ENCOUNTER — Encounter (HOSPITAL_COMMUNITY): Payer: Self-pay | Admitting: *Deleted

## 2013-06-18 DIAGNOSIS — N940 Mittelschmerz: Secondary | ICD-10-CM | POA: Insufficient documentation

## 2013-06-18 DIAGNOSIS — R1031 Right lower quadrant pain: Secondary | ICD-10-CM | POA: Insufficient documentation

## 2013-06-18 HISTORY — DX: Depression, unspecified: F32.A

## 2013-06-18 HISTORY — DX: Major depressive disorder, single episode, unspecified: F32.9

## 2013-06-18 LAB — URINE MICROSCOPIC-ADD ON

## 2013-06-18 LAB — URINALYSIS, ROUTINE W REFLEX MICROSCOPIC
Bilirubin Urine: NEGATIVE
GLUCOSE, UA: NEGATIVE mg/dL
Hgb urine dipstick: NEGATIVE
Ketones, ur: NEGATIVE mg/dL
Leukocytes, UA: NEGATIVE
Nitrite: NEGATIVE
SPECIFIC GRAVITY, URINE: 1.02 (ref 1.005–1.030)
UROBILINOGEN UA: 1 mg/dL (ref 0.0–1.0)
pH: 7.5 (ref 5.0–8.0)

## 2013-06-18 LAB — CBC
HCT: 37.8 % (ref 36.0–46.0)
Hemoglobin: 12.4 g/dL (ref 12.0–15.0)
MCH: 28.2 pg (ref 26.0–34.0)
MCHC: 32.8 g/dL (ref 30.0–36.0)
MCV: 85.9 fL (ref 78.0–100.0)
PLATELETS: 139 10*3/uL — AB (ref 150–400)
RBC: 4.4 MIL/uL (ref 3.87–5.11)
RDW: 12.9 % (ref 11.5–15.5)
WBC: 4.4 10*3/uL (ref 4.0–10.5)

## 2013-06-18 LAB — POCT PREGNANCY, URINE: PREG TEST UR: NEGATIVE

## 2013-06-18 LAB — WET PREP, GENITAL
Clue Cells Wet Prep HPF POC: NONE SEEN
TRICH WET PREP: NONE SEEN
Yeast Wet Prep HPF POC: NONE SEEN

## 2013-06-18 MED ORDER — KETOROLAC TROMETHAMINE 60 MG/2ML IM SOLN
60.0000 mg | Freq: Once | INTRAMUSCULAR | Status: AC
  Administered 2013-06-18: 60 mg via INTRAMUSCULAR
  Filled 2013-06-18: qty 2

## 2013-06-18 MED ORDER — KETOROLAC TROMETHAMINE 10 MG PO TABS: 10.0000 mg | ORAL_TABLET | Freq: Four times a day (QID) | ORAL | Status: DC | PRN

## 2013-06-18 NOTE — MAU Note (Signed)
Reports RLQ pain that began yesterday evening, described as sharp and throbbing. States pain is worse when moving. LMP 5/28.

## 2013-06-18 NOTE — Discharge Instructions (Signed)
Mittelschmerz  Mittelschmerz is lower abdominal pain that happens between menstrual periods. Mittelschmerz is a [place] word that means "middle pain." It may occur right before, during, or after ovulation. It is usually felt on either the right or left side, depending on which ovary is passing the egg.  CAUSES  Pain may be felt when:  There is irritation (inflammation) inside the abdomen. This is caused by the small amount of blood or fluid that may come from releasing the egg.  The covering of the ovary stretches.  Ovarian cysts develop.  You have endometriosis. This is when the uterine lining tissue grows outside of the uterus.  You have endometriomas. These are cysts that are formed by endometrial tissue. SYMPTOMS  Pain may be:  One-sided pain unless both ovaries are ovulating at the same time. If both ovaries are ovulating, there may be pain on both sides. This pain is often repeated every month. At times, there may be a month or two with no pain.  Dull, cramping, or sharp.  Short-lived or last up to 24 to 48 hours.  Felt with bowel movements, diarrhea, or intercourse.  May be accompanied by a slight amount of vaginal bleeding. DIAGNOSIS   Your caregiver will take a history and do a physical exam.  Blood tests and abdominal ultrasounds may be performed if the problem continues, becomes worse, or does not respond to the usual treatment.  A thin, lighted tube may be put into your abdomen (laparoscopy) to check for problems if the pain gets worse or does not go away. TREATMENT  Usually, no treatment is needed. If treatment is needed, it may include:  Taking over-the-counter pain relievers.  Taking birth control pills (oral contraceptives). This may be used to stop ovulation.  Medical or surgical treatment if you have endometriomas. Together, you and your caregiver can decide which course of treatment is best for you. HOME CARE INSTRUCTIONS   Only take over-the-counter or  prescription medicines for pain, discomfort, or fever as directed by your caregiver. Do not use aspirin. Aspirin may increase bleeding.  Write down when the pain comes in relation to your menstrual period. Write down how bad it is, if you have a fever with the pain, and how long it lasts. SEEK MEDICAL CARE IF:   Your pain increases and is not controlled with medicine.  Your pain is on both sides of your abdomen.  You develop vaginal bleeding (more than just spotting) with the pain.  You have a fever.  You develop nausea or vomiting.  You feel lightheaded or faint. MAKE SURE YOU:   Understand these instructions.  Will watch your condition.  Will get help right away if you are not doing well or get worse. Document Released: 12/16/2001 Document Revised: 03/20/2011 Document Reviewed: 03/25/2010 [facility] Patient Information 2014 [place], [place].

## 2013-06-18 NOTE — MAU Provider Note (Signed)
Attestation of Attending Supervision of Advanced Practitioner (PA/CNM/NP): Evaluation and management procedures were performed by the Advanced Practitioner under my supervision and collaboration.  I have reviewed the Advanced Practitioner's note and chart, and I agree with the management and plan.  Donnamae Jude, MD [facility] Attending 06/18/2013 7:28 AM

## 2013-06-18 NOTE — MAU Provider Note (Signed)
History     CSN: [id number]  Arrival date and time: 06/18/13 0124   None     Chief Complaint  Patient presents with  . Abdominal Pain   HPI  Michele Norman is a 33 y.o. female who presents with abdominal pain.  RLQ pain since Tuesday morning. Pain is sharp. No treatment for pain. Pain aggravated by movement. Denies fever, nausea, vomiting, diarrhea, constipation, dysuria, vaginal bleeding. Clear discharge that pt states is her normal "ovulation discharge". LMP 5/28.    Past Medical History  Diagnosis Date  . Medical history non-contributory   . Depression     Past Surgical History  Procedure Laterality Date  . Wisdom tooth extraction    . Induced abortion      Family History  Problem Relation Age of Onset  . Thyroid disease Father   . Heart murmur Brother   . Sickle cell anemia Brother   . Stroke Maternal Grandmother     History  Substance Use Topics  . Smoking status: Never Smoker   . Smokeless tobacco: Not on file  . Alcohol Use: Yes     Comment:  wine occasionally     Allergies: No Known Allergies  Prescriptions prior to admission  Medication Sig Dispense Refill  . loratadine (CLARITIN) 10 MG tablet Take 1 tablet (10 mg total) by mouth daily.  30 tablet  11  . sertraline (ZOLOFT) 50 MG tablet Take 1 tablet (50 mg total) by mouth daily.  30 tablet  11  . zolpidem (AMBIEN) 5 MG tablet Take 1 tablet (5 mg total) by mouth at bedtime as needed for sleep.  30 tablet  5    Review of Systems  Constitutional: Negative.   Gastrointestinal: Positive for abdominal pain. Negative for nausea, vomiting, diarrhea and constipation.  Genitourinary: Negative.    Physical Exam   Blood pressure 103/65, pulse 78, temperature 98.4 F (36.9 C), temperature source Oral, resp. rate 18, height 5\' 2"  (1.575 m), weight 118 lb (53.524 kg), last menstrual period 06/05/2013, unknown if currently breastfeeding.  Physical Exam  Nursing note and vitals reviewed. Constitutional: She  appears well-developed and well-nourished.  GI: Soft. Bowel sounds are normal. She exhibits no distension and no mass. There is tenderness (right suprapubic pain with deep palpation). There is no rebound and no guarding.  Pelvic Exam: NEFG. Moderate amount of odorless, thin, white discharge in vault and clear discharge coming from os. Cervix erythematous, tender. Neg CMT. Pos R adnexal tenderness. No mass. Neg left adnexal tenderness or mass.   MAU Course  Procedures  MDM CBC, ultrasound, urinalysis, GC/chalmydia, wet prep  Low suspicion for appendicitis due to location of pain and absence of fever, leukocytosis and GI complaints.   Assessment and Plan  A: 1. Ovulation pain    P: D/C home in stable condition.  Offered ABX for cervicitis, Declined.    Medication List         ketorolac 10 MG tablet  Commonly known as:  TORADOL  Take 1 tablet (10 mg total) by mouth every 6 (six) hours as needed.     loratadine 10 MG tablet  Commonly known as:  CLARITIN  Take 1 tablet (10 mg total) by mouth daily.     sertraline 50 MG tablet  Commonly known as:  ZOLOFT  Take 1 tablet (50 mg total) by mouth daily.     zolpidem 5 MG tablet  Commonly known as:  AMBIEN  Take 1 tablet (5 mg total) by mouth at  bedtime as needed for sleep.        Follow-up Information   Follow up with [place] [facility]. (As needed in emergencies)    Contact information:   [place] [address]       Follow up with [name], MD. (As needed)    Specialty:  Obstetrics and Gynecology   Contact information:   [place] [address] [phone]      Maren [place], [place] 06/18/2013, 2:31 AM   I was present for the exam and agree with above.  [place], CNM 06/18/2013 4:59 AM

## 2013-06-19 LAB — GC/CHLAMYDIA PROBE AMP
CT Probe RNA: NEGATIVE
GC Probe RNA: NEGATIVE

## 2013-10-16 ENCOUNTER — Ambulatory Visit: Payer: Medicaid Other | Admitting: Obstetrics

## 2013-11-10 ENCOUNTER — Inpatient Hospital Stay (HOSPITAL_COMMUNITY): Payer: Medicaid Other

## 2013-11-10 ENCOUNTER — Encounter (HOSPITAL_COMMUNITY): Payer: Self-pay | Admitting: *Deleted

## 2013-11-10 ENCOUNTER — Inpatient Hospital Stay (HOSPITAL_COMMUNITY)
Admission: AD | Admit: 2013-11-10 | Discharge: 2013-11-10 | Disposition: A | Discharge status: Home / Self Care | Payer: Medicaid Other | Source: Ambulatory Visit | Attending: Obstetrics | Admitting: Obstetrics

## 2013-11-10 DIAGNOSIS — R102 Pelvic and perineal pain: Secondary | ICD-10-CM

## 2013-11-10 DIAGNOSIS — R1032 Left lower quadrant pain: Secondary | ICD-10-CM | POA: Diagnosis not present

## 2013-11-10 DIAGNOSIS — N8320 Unspecified ovarian cysts: Secondary | ICD-10-CM | POA: Diagnosis present

## 2013-11-10 DIAGNOSIS — N83202 Unspecified ovarian cyst, left side: Secondary | ICD-10-CM

## 2013-11-10 DIAGNOSIS — D259 Leiomyoma of uterus, unspecified: Secondary | ICD-10-CM | POA: Diagnosis not present

## 2013-11-10 HISTORY — DX: Unspecified abnormal cytological findings in specimens from vagina: R87.629

## 2013-11-10 LAB — CBC WITH DIFFERENTIAL/PLATELET
Basophils Absolute: 0 10*3/uL (ref 0.0–0.1)
Basophils Relative: 0 % (ref 0–1)
EOS PCT: 3 % (ref 0–5)
Eosinophils Absolute: 0.1 10*3/uL (ref 0.0–0.7)
HCT: 42.5 % (ref 36.0–46.0)
Hemoglobin: 14.3 g/dL (ref 12.0–15.0)
LYMPHS PCT: 31 % (ref 12–46)
Lymphs Abs: 1.3 10*3/uL (ref 0.7–4.0)
MCH: 28.3 pg (ref 26.0–34.0)
MCHC: 33.6 g/dL (ref 30.0–36.0)
MCV: 84.2 fL (ref 78.0–100.0)
Monocytes Absolute: 0.3 10*3/uL (ref 0.1–1.0)
Monocytes Relative: 8 % (ref 3–12)
NEUTROS ABS: 2.4 10*3/uL (ref 1.7–7.7)
Neutrophils Relative %: 58 % (ref 43–77)
PLATELETS: 153 10*3/uL (ref 150–400)
RBC: 5.05 MIL/uL (ref 3.87–5.11)
RDW: 12.8 % (ref 11.5–15.5)
WBC: 4.1 10*3/uL (ref 4.0–10.5)

## 2013-11-10 LAB — WET PREP, GENITAL
Clue Cells Wet Prep HPF POC: NONE SEEN
Trich, Wet Prep: NONE SEEN
Yeast Wet Prep HPF POC: NONE SEEN

## 2013-11-10 LAB — URINALYSIS, ROUTINE W REFLEX MICROSCOPIC
BILIRUBIN URINE: NEGATIVE
Glucose, UA: NEGATIVE mg/dL
Hgb urine dipstick: NEGATIVE
KETONES UR: 15 mg/dL — AB
Leukocytes, UA: NEGATIVE
NITRITE: NEGATIVE
PROTEIN: NEGATIVE mg/dL
Specific Gravity, Urine: >1.03 — ABNORMAL HIGH (ref 1.005–1.030)
UROBILINOGEN UA: 0.2 mg/dL (ref 0.0–1.0)
pH: 6 (ref 5.0–8.0)

## 2013-11-10 LAB — HCG, QUANTITATIVE, PREGNANCY

## 2013-11-10 LAB — POCT PREGNANCY, URINE: Preg Test, Ur: NEGATIVE

## 2013-11-10 MED ORDER — IBUPROFEN 800 MG PO TABS: 800.0000 mg | ORAL_TABLET | Freq: Three times a day (TID) | ORAL | Status: DC

## 2013-11-10 MED ORDER — KETOROLAC TROMETHAMINE 60 MG/2ML IM SOLN
60.0000 mg | Freq: Once | INTRAMUSCULAR | Status: AC
  Administered 2013-11-10: 60 mg via INTRAMUSCULAR
  Filled 2013-11-10: qty 2

## 2013-11-10 NOTE — Discharge Instructions (Signed)
Fibroids °Fibroids are lumps (tumors) that can occur any place in a woman's body. These lumps are not cancerous. Fibroids vary in size, weight, and where they grow. °HOME CARE °· Do not take aspirin. °· Write down the number of pads or tampons you use during your period. Tell your doctor. This can help determine the best treatment for you. °GET HELP RIGHT AWAY IF: °· You have pain in your lower belly (abdomen) that is not helped with medicine. °· You have cramps that are not helped with medicine. °· You have more bleeding between or during your period. °· You feel lightheaded or pass out (faint). °· Your lower belly pain gets worse. °MAKE SURE YOU: °· Understand these instructions. °· Will watch your condition. °· Will get help right away if you are not doing well or get worse. °Document Released: 01/28/2010 Document Revised: 03/20/2011 Document Reviewed: 01/28/2010 °ExitCare® Patient Information ©2015 [employer]. This information is not intended to replace advice given to you by your health care provider. Make sure you discuss any questions you have with your health care provider. °Ovarian Cyst °An ovarian cyst is a sac filled with fluid or blood. This sac is attached to the ovary. Some cysts go away on their own. Other cysts need treatment.  °HOME CARE  °· Only take medicine as told by your doctor. °· Follow up with your doctor as told. °· Get regular pelvic exams and Pap tests. °GET HELP IF: °· Your periods are late, not regular, or painful. °· You stop having periods. °· Your belly (abdominal) or pelvic pain does not go away. °· Your belly becomes large or puffy (swollen). °· You have a hard time peeing (totally emptying your bladder). °· You have pressure on your bladder. °· You have pain during sex. °· You feel fullness, pressure, or discomfort in your belly. °· You lose weight for no reason. °· You feel sick most of the time. °· You have a hard time pooping (constipation). °· You do not feel like  eating. °· You develop pimples (acne). °· You have an increase in hair on your body and face. °· You are gaining weight for no reason. °· You think you are pregnant. °GET HELP RIGHT AWAY IF:  °· Your belly pain gets worse. °· You feel sick to your stomach (nauseous), and you throw up (vomit). °· You have a fever that comes on fast. °· You have belly pain while pooping (bowel movement). °· Your periods are heavier than usual. °MAKE SURE YOU:  °· Understand these instructions. °· Will watch your condition. °· Will get help right away if you are not doing well or get worse. °Document Released: 06/14/2007 Document Revised: 10/16/2012 Document Reviewed: 09/02/2012 °ExitCare® Patient Information ©2015 [employer]. This information is not intended to replace advice given to you by your health care provider. Make sure you discuss any questions you have with your health care provider. ° °

## 2013-11-10 NOTE — MAU Provider Note (Signed)
History     CSN: [id number]  Arrival date and time: 11/10/13 1444   First Provider Initiated Contact with Patient 11/10/13 1618      No chief complaint on file.  HPI  Michele Norman is a 33 y.o. G3P0030 who presents to MAU today with complaint of LLQ pain since yesterday. She states that pain is constant and was worse last night. She also states that the area "feels swollen." She denies any abnormal vaginal discharge, fever, N/V/D or constipation. She states occasional spotting today and felt that was normal because she is due to have a period in the next 1-2 days. She states last intercourse ~ 1 week ago.    OB History    Gravida Para Term Preterm AB TAB SAB Ectopic Multiple Living   3    3 1 2    0      Past Medical History  Diagnosis Date  . Medical history non-contributory   . Depression   . Vaginal Pap smear, abnormal     Past Surgical History  Procedure Laterality Date  . Wisdom tooth extraction    . Induced abortion      Family History  Problem Relation Age of Onset  . Thyroid disease Father   . Heart murmur Brother   . Sickle cell anemia Brother   . Stroke Maternal Grandmother     History  Substance Use Topics  . Smoking status: Never Smoker   . Smokeless tobacco: Not on file  . Alcohol Use: Yes     Comment:  wine occasionally     Allergies: No Known Allergies  Prescriptions prior to admission  Medication Sig Dispense Refill Last Dose  . sertraline (ZOLOFT) 50 MG tablet Take 1 tablet (50 mg total) by mouth daily. 30 tablet 11 Past Month at Unknown time  . ketorolac (TORADOL) 10 MG tablet Take 1 tablet (10 mg total) by mouth every 6 (six) hours as needed. (Patient not taking: Reported on 11/10/2013) 20 tablet 0   . loratadine (CLARITIN) 10 MG tablet Take 1 tablet (10 mg total) by mouth daily. (Patient not taking: Reported on 11/10/2013) 30 tablet 11 06/17/2013 at Unknown time  . zolpidem (AMBIEN) 5 MG tablet Take 1 tablet (5 mg total) by mouth at bedtime  as needed for sleep. (Patient not taking: Reported on 11/10/2013) 30 tablet 5 Past Week at Unknown time    Review of Systems  Constitutional: Negative for fever and malaise/fatigue.  Gastrointestinal: Positive for abdominal pain. Negative for nausea, vomiting, diarrhea and constipation.  Genitourinary: Negative for dysuria, urgency and frequency.       Neg - vaginal bleeding, discharge   Physical Exam   Blood pressure 107/72, pulse 78, temperature 98.8 F (37.1 C), temperature source Oral, resp. rate 16, last menstrual period 10/15/2013, not currently breastfeeding.  Physical Exam  Constitutional: She is oriented to person, place, and time. She appears well-developed and well-nourished. No distress.  HENT:  Head: Normocephalic.  Cardiovascular: Normal rate.   Respiratory: Effort normal.  GI: Soft. Bowel sounds are normal. She exhibits no distension and no mass. There is tenderness (mild left sided suprapubic tenderness to palpation ). There is no rebound and no guarding.  Genitourinary: Uterus is not enlarged and not tender. Cervix exhibits friability. Cervix exhibits no motion tenderness and no discharge. Right adnexum displays no mass and no tenderness. Left adnexum displays tenderness (moderate tendnerness to palpation). Left adnexum displays no mass. There is bleeding (scant blood on cervix only) in the  vagina. Vaginal discharge (moderate amount of thick, yellow, mucus discharge) found.  Neurological: She is alert and oriented to person, place, and time.  Skin: Skin is warm and dry. No erythema.  Psychiatric: She has a normal mood and affect.   Results for orders placed or performed during the hospital encounter of 11/10/13 (from the past 24 hour(s))  Urinalysis, Routine w reflex microscopic     Status: Abnormal   Collection Time: 11/10/13  3:43 PM  Result Value Ref Range   Color, Urine YELLOW YELLOW   APPearance CLEAR CLEAR   Specific Gravity, Urine >1.030 (H) 1.005 - 1.030   pH  6.0 5.0 - 8.0   Glucose, UA NEGATIVE NEGATIVE mg/dL   Hgb urine dipstick NEGATIVE NEGATIVE   Bilirubin Urine NEGATIVE NEGATIVE   Ketones, ur 15 (A) NEGATIVE mg/dL   Protein, ur NEGATIVE NEGATIVE mg/dL   Urobilinogen, UA 0.2 0.0 - 1.0 mg/dL   Nitrite NEGATIVE NEGATIVE   Leukocytes, UA NEGATIVE NEGATIVE  Pregnancy, urine POC     Status: None   Collection Time: 11/10/13  3:58 PM  Result Value Ref Range   Preg Test, Ur NEGATIVE NEGATIVE  Wet prep, genital     Status: Abnormal   Collection Time: 11/10/13  4:30 PM  Result Value Ref Range   Yeast Wet Prep HPF POC NONE SEEN NONE SEEN   Trich, Wet Prep NONE SEEN NONE SEEN   Clue Cells Wet Prep HPF POC NONE SEEN NONE SEEN   WBC, Wet Prep HPF POC FEW (A) NONE SEEN  hCG, quantitative, pregnancy     Status: None   Collection Time: 11/10/13  4:30 PM  Result Value Ref Range   hCG, Beta Chain, Quant, S <1 <5 mIU/mL  CBC with Differential     Status: None   Collection Time: 11/10/13  4:30 PM  Result Value Ref Range   WBC 4.1 4.0 - 10.5 K/uL   RBC 5.05 3.87 - 5.11 MIL/uL   Hemoglobin 14.3 12.0 - 15.0 g/dL   HCT 42.5 36.0 - 46.0 %   MCV 84.2 78.0 - 100.0 fL   MCH 28.3 26.0 - 34.0 pg   MCHC 33.6 30.0 - 36.0 g/dL   RDW 12.8 11.5 - 15.5 %   Platelets 153 150 - 400 K/uL   Neutrophils Relative % 58 43 - 77 %   Neutro Abs 2.4 1.7 - 7.7 K/uL   Lymphocytes Relative 31 12 - 46 %   Lymphs Abs 1.3 0.7 - 4.0 K/uL   Monocytes Relative 8 3 - 12 %   Monocytes Absolute 0.3 0.1 - 1.0 K/uL   Eosinophils Relative 3 0 - 5 %   Eosinophils Absolute 0.1 0.0 - 0.7 K/uL   Basophils Relative 0 0 - 1 %   Basophils Absolute 0.0 0.0 - 0.1 K/uL   US Transvaginal Non-ob  11/10/2013   CLINICAL DATA:  Left lower quadrant pain.  EXAM: TRANSABDOMINAL AND TRANSVAGINAL ULTRASOUND OF PELVIS  TECHNIQUE: Both transabdominal and transvaginal ultrasound examinations of the pelvis were performed. Transabdominal technique was performed for global imaging of the pelvis including  uterus, ovaries, adnexal regions, and pelvic cul-de-sac. It was necessary to proceed with endovaginal exam following the transabdominal exam to visualize the ovaries and uterus.  COMPARISON:  06/18/2013  FINDINGS: Uterus  Measurements: 9.6 x 3.9 x 5.5 cm. Hypoechoic structures in the cervix are compatible with nabothian cysts. Normal anteverted position of the uterus. There is a small heterogeneous structure in the anterior uterus that  measures up to 0.6 cm and suggestive for a small fibroid. There is a second hypoechoic structure near the endometrium that measures up to 0.7 cm and could also represent a small fibroid.  Endometrium  Thickness: 0.6 cm.  No focal abnormality visualized.  Right ovary  Measurements: 1.6 x 2.8 x 1.5 cm. Normal appearance/no adnexal mass.  Left ovary  Measurements: 3.0 x 2.7 x 1.5 cm. Normal appearance of the left ovary. There is a 1.4 cm hypoechoic structure in the left ovary that is consistent with a follicle.  Other findings  No free fluid.  IMPRESSION: No acute abnormality.  Small uterine fibroids.   Electronically Signed   By: Markus Daft M.D.   On: 11/10/2013 17:51   US Pelvis Complete  11/10/2013   CLINICAL DATA:  Left lower quadrant pain.  EXAM: TRANSABDOMINAL AND TRANSVAGINAL ULTRASOUND OF PELVIS  TECHNIQUE: Both transabdominal and transvaginal ultrasound examinations of the pelvis were performed. Transabdominal technique was performed for global imaging of the pelvis including uterus, ovaries, adnexal regions, and pelvic cul-de-sac. It was necessary to proceed with endovaginal exam following the transabdominal exam to visualize the ovaries and uterus.  COMPARISON:  06/18/2013  FINDINGS: Uterus  Measurements: 9.6 x 3.9 x 5.5 cm. Hypoechoic structures in the cervix are compatible with nabothian cysts. Normal anteverted position of the uterus. There is a small heterogeneous structure in the anterior uterus that measures up to 0.6 cm and suggestive for a small fibroid. There is a  second hypoechoic structure near the endometrium that measures up to 0.7 cm and could also represent a small fibroid.  Endometrium  Thickness: 0.6 cm.  No focal abnormality visualized.  Right ovary  Measurements: 1.6 x 2.8 x 1.5 cm. Normal appearance/no adnexal mass.  Left ovary  Measurements: 3.0 x 2.7 x 1.5 cm. Normal appearance of the left ovary. There is a 1.4 cm hypoechoic structure in the left ovary that is consistent with a follicle.  Other findings  No free fluid.  IMPRESSION: No acute abnormality.  Small uterine fibroids.   Electronically Signed   By: Markus Daft M.D.   On: 11/10/2013 17:51    MAU Course  Procedures None  MDM UPT - negative UA, wet prep, GC/Chlamydia, CBC, HIV, quant hCG and [place] today  Assessment and Plan  A: Ovarian cyst Uterine fibroids Pelvic pain in female  P: Discharge home Rx for Ibuprofen sent to patient's pharmacy Patient advised to follow-up with [facility] as needed or if her symptoms were to change or worsen Patient may return to MAU as needed or if her condition were to change or worsen  Luvenia Redden, PA-C  11/10/2013, 5:58 PM

## 2013-11-10 NOTE — MAU Note (Signed)
Pt co lower left sided pain that started last night, hurts to touch and apply slight pressure to area, feels tender and hurts to move. Took 600mg  ibuprofen that helped last night. Reports pale pink scant spotting, but says her period is about to begin.

## 2013-11-11 LAB — GC/CHLAMYDIA PROBE AMP
CT PROBE, AMP APTIMA: NEGATIVE
GC PROBE AMP APTIMA: NEGATIVE

## 2013-11-11 LAB — HIV ANTIBODY (ROUTINE TESTING W REFLEX): HIV 1&2 Ab, 4th Generation: NONREACTIVE

## 2013-11-17 ENCOUNTER — Telehealth: Payer: Self-pay | Admitting: *Deleted

## 2013-11-17 NOTE — Telephone Encounter (Signed)
Patient states she went to the hospital on 11-10-13 and has some questions in regards to her ultrasound. Patient given an appointment for a consult on 11-20-13.

## 2013-11-20 ENCOUNTER — Ambulatory Visit (INDEPENDENT_AMBULATORY_CARE_PROVIDER_SITE_OTHER): Payer: Medicaid Other | Admitting: Obstetrics

## 2013-11-20 VITALS — BP 109/73 | HR 80 | Wt 115.0 lb

## 2013-11-20 DIAGNOSIS — D251 Intramural leiomyoma of uterus: Secondary | ICD-10-CM

## 2013-11-20 NOTE — Patient Instructions (Signed)

## 2013-11-20 NOTE — Progress Notes (Signed)
Patient ID: Michele Norman, female   DOB: Jan 31, 1980, 33 y.o.   MRN: [id number]  Chief Complaint  Patient presents with  . Advice Only    HPI Michele Norman is a 33 y.o. female.  F/U for ultrasound results.  HPI  Past Medical History  Diagnosis Date  . Medical history non-contributory   . Depression   . Vaginal Pap smear, abnormal     Past Surgical History  Procedure Laterality Date  . Wisdom tooth extraction    . Induced abortion      Family History  Problem Relation Age of Onset  . Thyroid disease Father   . Heart murmur Brother   . Sickle cell anemia Brother   . Stroke Maternal Grandmother     Social History History  Substance Use Topics  . Smoking status: Never Smoker   . Smokeless tobacco: Not on file  . Alcohol Use: Yes     Comment:  wine occasionally     No Known Allergies  Current Outpatient Prescriptions  Medication Sig Dispense Refill  . ibuprofen (ADVIL,MOTRIN) 800 MG tablet Take 1 tablet (800 mg total) by mouth 3 (three) times daily. 30 tablet 0  . sertraline (ZOLOFT) 50 MG tablet Take 1 tablet (50 mg total) by mouth daily. 30 tablet 11   No current facility-administered medications for this visit.    Review of Systems Review of Systems Constitutional: negative for fatigue and weight loss Respiratory: negative for cough and wheezing Cardiovascular: negative for chest pain, fatigue and palpitations Gastrointestinal: negative for abdominal pain and change in bowel habits Genitourinary:negative Integument/breast: negative for nipple discharge Musculoskeletal:negative for myalgias Neurological: negative for gait problems and tremors Behavioral/Psych: negative for abusive relationship, depression Endocrine: negative for temperature intolerance     Blood pressure 109/73, pulse 80, weight 115 lb (52.164 kg), last menstrual period 11/12/2013.  Physical Exam Physical Exam:  deferred   Data Reviewed Ultrasound  CLINICAL DATA: Left lower quadrant  pain.  EXAM: TRANSABDOMINAL AND TRANSVAGINAL ULTRASOUND OF PELVIS  TECHNIQUE: Both transabdominal and transvaginal ultrasound examinations of the pelvis were performed. Transabdominal technique was performed for global imaging of the pelvis including uterus, ovaries, adnexal regions, and pelvic cul-de-sac. It was necessary to proceed with endovaginal exam following the transabdominal exam to visualize the ovaries and uterus.  COMPARISON: 06/18/2013  FINDINGS: Uterus  Measurements: 9.6 x 3.9 x 5.5 cm. Hypoechoic structures in the cervix are compatible with nabothian cysts. Normal anteverted position of the uterus. There is a small heterogeneous structure in the anterior uterus that measures up to 0.6 cm and suggestive for a small fibroid. There is a second hypoechoic structure near the endometrium that measures up to 0.7 cm and could also represent a small fibroid.  Endometrium  Thickness: 0.6 cm. No focal abnormality visualized.  Right ovary  Measurements: 1.6 x 2.8 x 1.5 cm. Normal appearance/no adnexal mass.  Left ovary  Measurements: 3.0 x 2.7 x 1.5 cm. Normal appearance of the left ovary. There is a 1.4 cm hypoechoic structure in the left ovary that is consistent with a follicle.  Other findings  No free fluid.  IMPRESSION: No acute abnormality.  Small uterine fibroids.   Electronically Signed  By: Markus Daft M.D.  On: 11/10/2013 17:51      Assessment    Pelvic pain.  Stable.     Plan  NSAIDS Rx.  No orders of the defined types were placed in this encounter.   No orders of the defined types were placed in this encounter.  HARPER,CHARLES A 11/20/2013, 4:42 PM

## 2013-11-22 ENCOUNTER — Encounter: Payer: Self-pay | Admitting: Obstetrics

## 2014-02-09 HISTORY — PX: WISDOM TOOTH EXTRACTION: SHX21

## 2014-04-16 ENCOUNTER — Other Ambulatory Visit: Payer: Self-pay | Admitting: Obstetrics and Gynecology

## 2014-04-16 DIAGNOSIS — N859 Noninflammatory disorder of uterus, unspecified: Secondary | ICD-10-CM

## 2014-04-16 DIAGNOSIS — Q51818 Other congenital malformations of uterus: Secondary | ICD-10-CM

## 2014-04-18 ENCOUNTER — Ambulatory Visit
Admission: RE | Admit: 2014-04-18 | Discharge: 2014-04-18 | Disposition: A | Discharge status: Home / Self Care | Payer: 59 | Source: Ambulatory Visit | Attending: Obstetrics and Gynecology | Admitting: Obstetrics and Gynecology

## 2014-04-18 DIAGNOSIS — N859 Noninflammatory disorder of uterus, unspecified: Secondary | ICD-10-CM

## 2014-04-18 DIAGNOSIS — Q51818 Other congenital malformations of uterus: Secondary | ICD-10-CM

## 2014-04-18 MED ORDER — GADOBENATE DIMEGLUMINE 529 MG/ML IV SOLN
10.0000 mL | Freq: Once | INTRAVENOUS | Status: AC | PRN
  Administered 2014-04-18: 10 mL via INTRAVENOUS

## 2014-09-22 IMAGING — US US OB TRANSVAGINAL
1 series · 14 of 28 positions shown · non-contrast
Comparison: US OB TRANSVAGINAL dated 04/05/2013

CLINICAL DATA: Missed abortion.  Follow-up.

EXAM:
TRANSVAGINAL OB ULTRASOUND
TECHNIQUE: Transvaginal ultrasound was performed for complete evaluation of the
gestation as well as the maternal uterus, adnexal regions, and
pelvic cul-de-sac.

[Series 1: us ob transvaginal · 29 acquisitions, 14 frames shown]
[im 2/29]
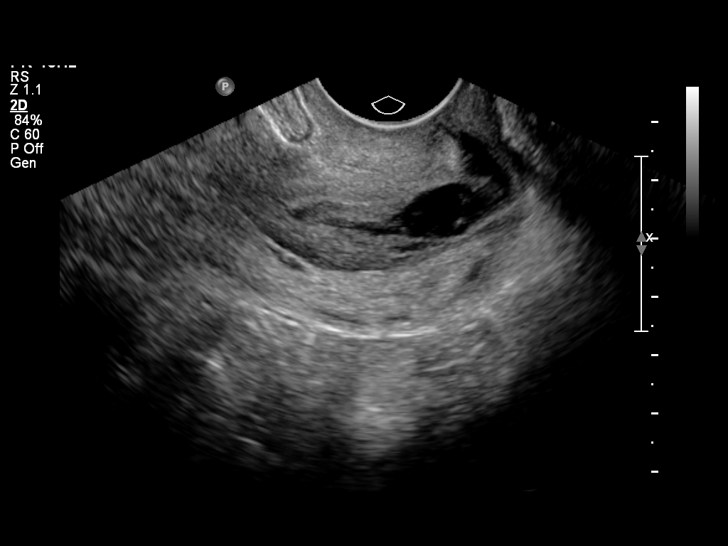
[im 4/29]
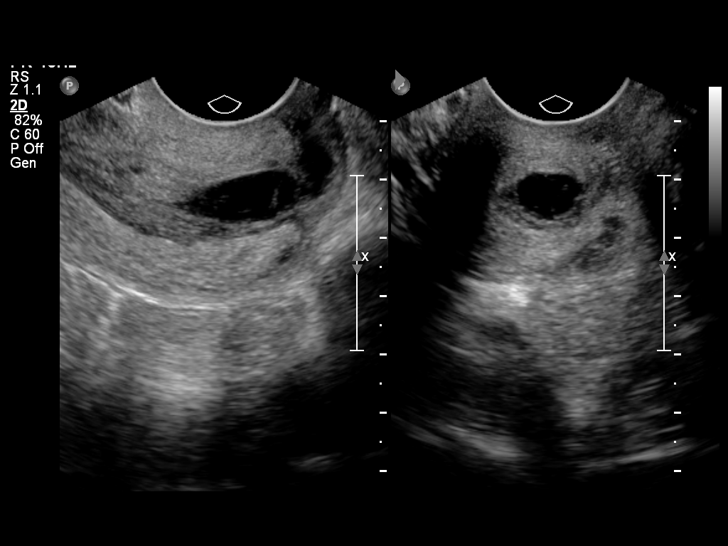
[im 6/29]
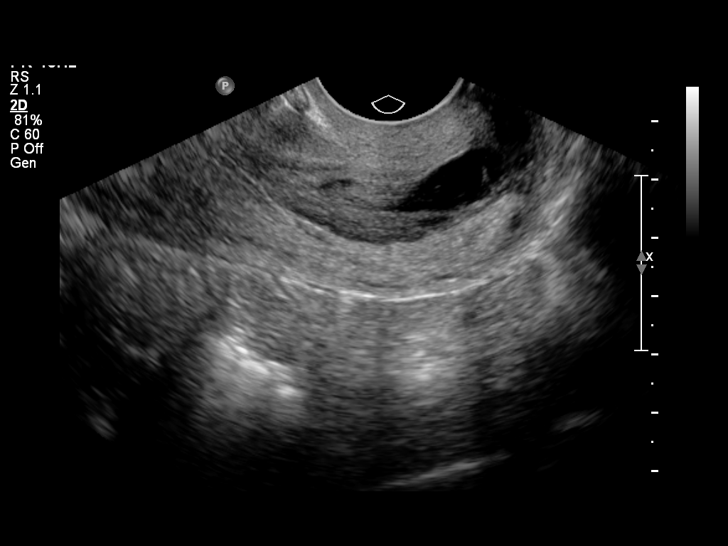
[im 8/29]
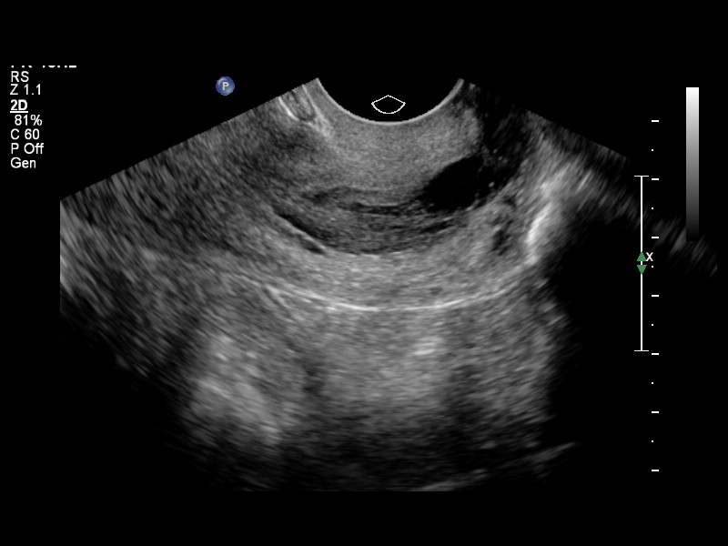
[im 10/29]
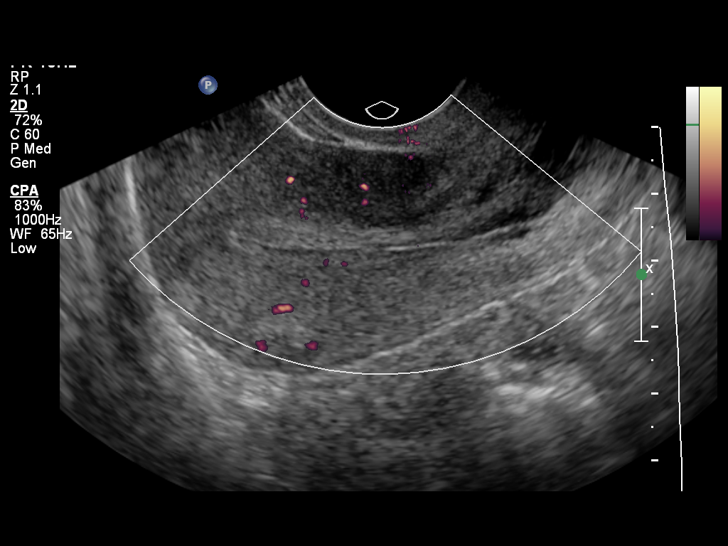
[im 12/29]
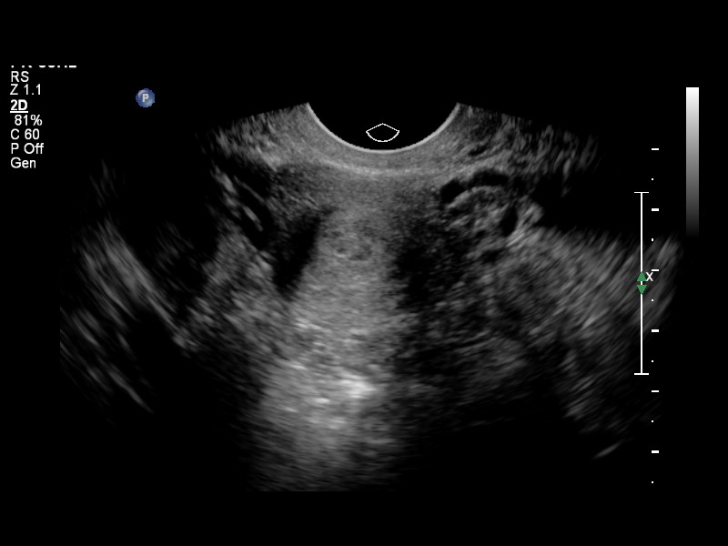
[im 14/29]
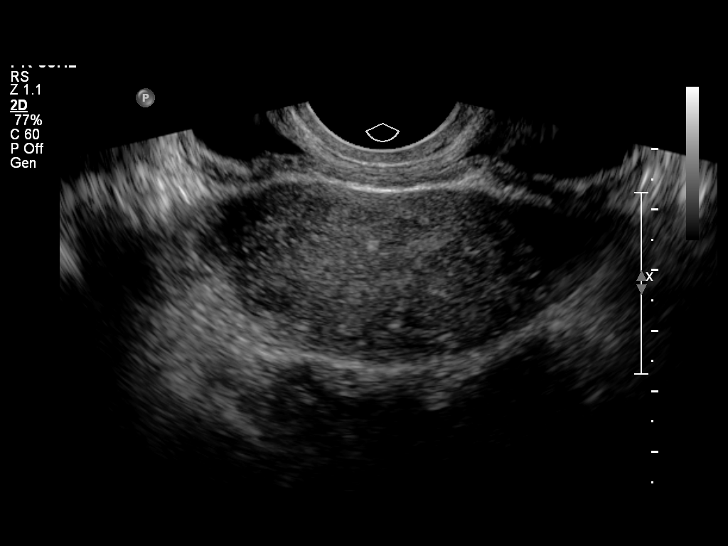
[im 16/29]
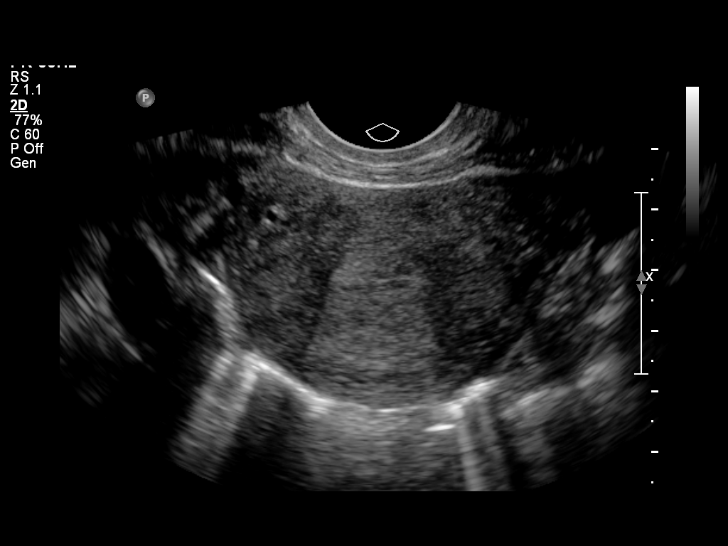
[im 18/29]
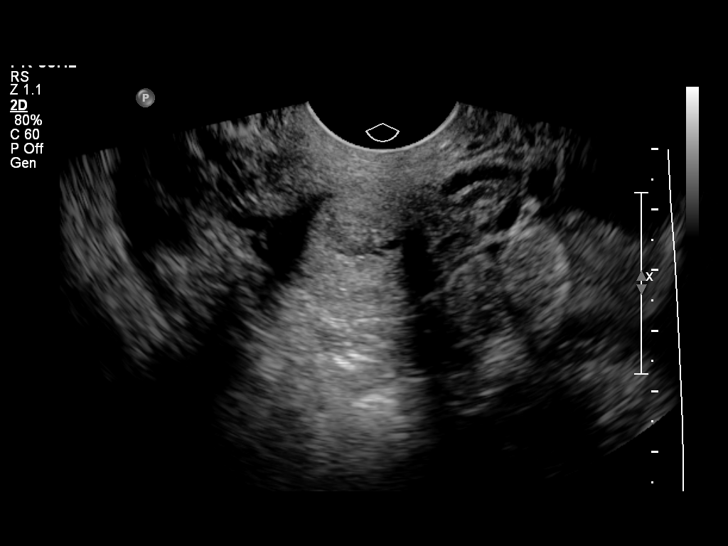
[im 20/29]
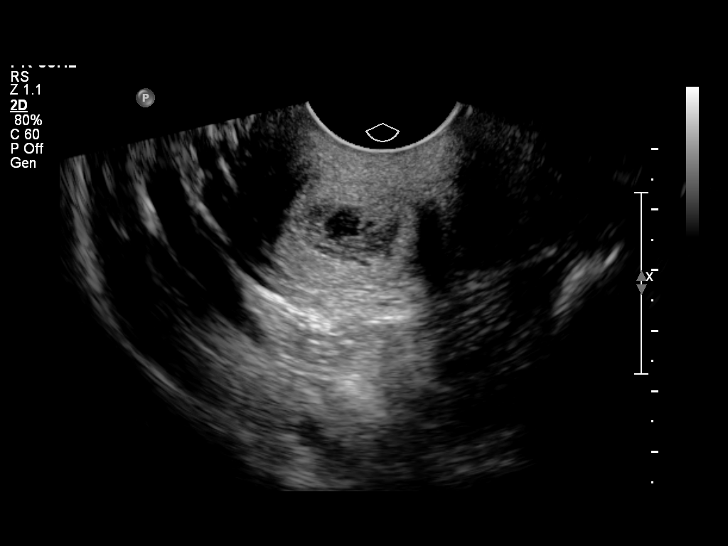
[im 22/29]
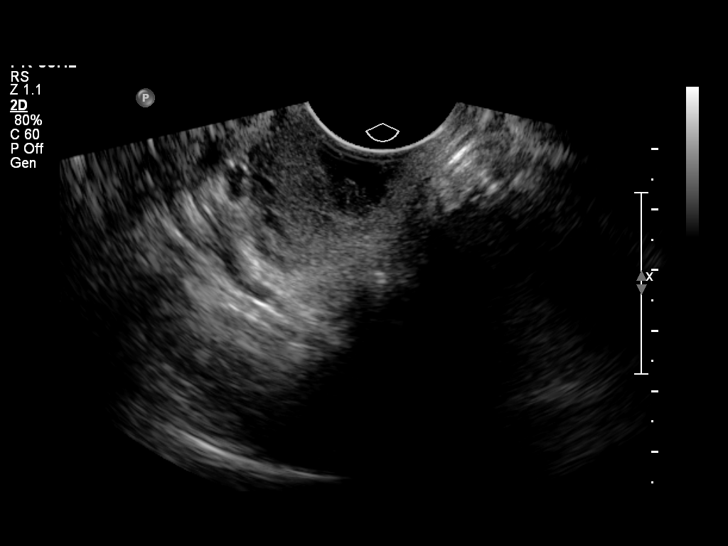
[im 24/29]
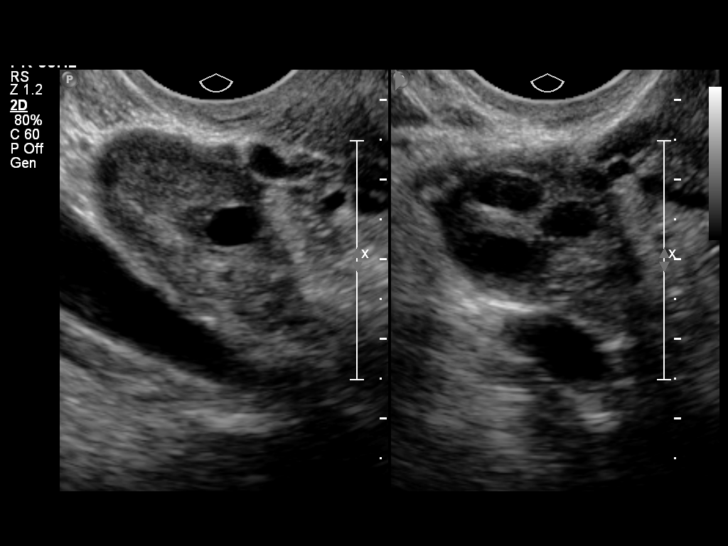
[im 26/29]
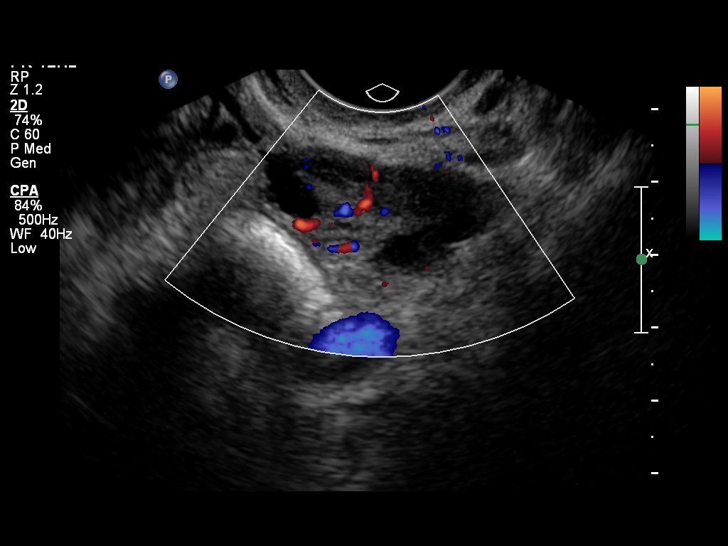
[im 29/29]
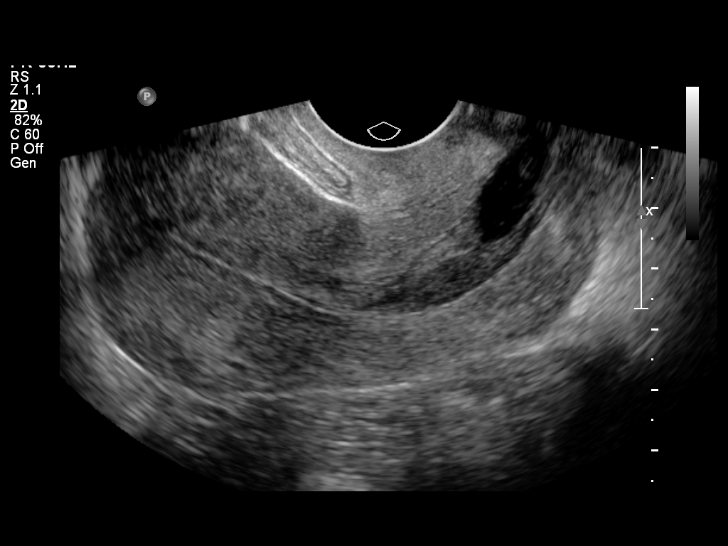

[14 of 28 positions shown; findings below may reference images not displayed]

FINDINGS: Intrauterine gestational sac: None visualized

Yolk sac:  None visualized

Embryo:  None visualized

Maternal uterus/adnexae: Irregularly-shaped fluid collection noted
in the lower uterus segment/cervix region. This is new since prior
study. This likely reflects liquified blood products/ clot.
Endometrium mildly thickened in this area.

Ovaries are symmetric in size and echotexture. No adnexal masses. No
free fluid.
IMPRESSION: Thickened endometrium and lower uterine segment and cervix region
with hypoechoic/cystic areas, likely liquified hematoma.

No intrauterine gestation visualized.

## 2014-09-29 ENCOUNTER — Encounter (HOSPITAL_BASED_OUTPATIENT_CLINIC_OR_DEPARTMENT_OTHER): Payer: Self-pay | Admitting: *Deleted

## 2014-09-30 ENCOUNTER — Encounter (HOSPITAL_BASED_OUTPATIENT_CLINIC_OR_DEPARTMENT_OTHER): Payer: Self-pay | Admitting: *Deleted

## 2014-09-30 NOTE — Progress Notes (Signed)
NPO AFTER MN WITH EXCEPTION CLEAR LIQUIDS UNTIL 0630.  ARRIVE AT 1115.  NEEDS HG AND URINE PREG.

## 2014-10-07 ENCOUNTER — Ambulatory Visit (HOSPITAL_BASED_OUTPATIENT_CLINIC_OR_DEPARTMENT_OTHER)
Admission: RE | Admit: 2014-10-07 | Discharge: 2014-10-07 | Disposition: A | Discharge status: Home / Self Care | Payer: 59 | Source: Ambulatory Visit | Attending: Obstetrics and Gynecology | Admitting: Obstetrics and Gynecology

## 2014-10-07 ENCOUNTER — Encounter (HOSPITAL_BASED_OUTPATIENT_CLINIC_OR_DEPARTMENT_OTHER): Payer: Self-pay

## 2014-10-07 ENCOUNTER — Encounter (HOSPITAL_BASED_OUTPATIENT_CLINIC_OR_DEPARTMENT_OTHER)
Admission: RE | Disposition: A | Discharge status: Home / Self Care | Payer: Self-pay | Source: Ambulatory Visit | Attending: Obstetrics and Gynecology

## 2014-10-07 ENCOUNTER — Ambulatory Visit (HOSPITAL_BASED_OUTPATIENT_CLINIC_OR_DEPARTMENT_OTHER): Payer: 59 | Admitting: Anesthesiology

## 2014-10-07 DIAGNOSIS — N96 Recurrent pregnancy loss: Secondary | ICD-10-CM | POA: Diagnosis present

## 2014-10-07 DIAGNOSIS — F329 Major depressive disorder, single episode, unspecified: Secondary | ICD-10-CM | POA: Insufficient documentation

## 2014-10-07 HISTORY — PX: HYSTEROSCOPY: SHX211

## 2014-10-07 HISTORY — DX: Other doubling of uterus, unspecified: Q51.20

## 2014-10-07 HISTORY — DX: Other and unspecified doubling of uterus: Q51.28

## 2014-10-07 HISTORY — DX: Personal history of other diseases of the female genital tract: Z87.42

## 2014-10-07 HISTORY — DX: Malabsorption due to intolerance, not elsewhere classified: K90.49

## 2014-10-07 LAB — POCT PREGNANCY, URINE: Preg Test, Ur: NEGATIVE

## 2014-10-07 LAB — POCT HEMOGLOBIN-HEMACUE: HEMOGLOBIN: 14.1 g/dL (ref 12.0–15.0)

## 2014-10-07 SURGERY — HYSTEROSCOPY
Anesthesia: General | Site: Uterus

## 2014-10-07 MED ORDER — FENTANYL CITRATE (PF) 100 MCG/2ML IJ SOLN
INTRAMUSCULAR | Status: AC
  Filled 2014-10-07: qty 4

## 2014-10-07 MED ORDER — LACTATED RINGERS IV SOLN
INTRAVENOUS | Status: DC
  Administered 2014-10-07: 12:00:00 via INTRAVENOUS
  Filled 2014-10-07: qty 1000

## 2014-10-07 MED ORDER — PROMETHAZINE HCL 25 MG/ML IJ SOLN
6.2500 mg | INTRAMUSCULAR | Status: DC | PRN
  Filled 2014-10-07: qty 1

## 2014-10-07 MED ORDER — MIDAZOLAM HCL 2 MG/2ML IJ SOLN
INTRAMUSCULAR | Status: AC
  Filled 2014-10-07: qty 2

## 2014-10-07 MED ORDER — LIDOCAINE HCL (CARDIAC) 20 MG/ML IV SOLN
INTRAVENOUS | Status: DC | PRN
  Administered 2014-10-07: 60 mg via INTRAVENOUS

## 2014-10-07 MED ORDER — DEXAMETHASONE SODIUM PHOSPHATE 4 MG/ML IJ SOLN
INTRAMUSCULAR | Status: DC | PRN
  Administered 2014-10-07: 10 mg via INTRAVENOUS

## 2014-10-07 MED ORDER — CEFAZOLIN SODIUM-DEXTROSE 2-3 GM-% IV SOLR
2.0000 g | INTRAVENOUS | Status: AC
Start: 2014-10-07 — End: 2014-10-07
  Administered 2014-10-07: 2 g via INTRAVENOUS
  Filled 2014-10-07: qty 50

## 2014-10-07 MED ORDER — CEFAZOLIN SODIUM-DEXTROSE 2-3 GM-% IV SOLR
INTRAVENOUS | Status: AC
  Filled 2014-10-07: qty 50

## 2014-10-07 MED ORDER — FENTANYL CITRATE (PF) 100 MCG/2ML IJ SOLN
INTRAMUSCULAR | Status: AC
  Filled 2014-10-07: qty 2

## 2014-10-07 MED ORDER — FENTANYL CITRATE (PF) 100 MCG/2ML IJ SOLN
25.0000 ug | INTRAMUSCULAR | Status: DC | PRN
  Administered 2014-10-07: 50 ug via INTRAVENOUS
  Filled 2014-10-07: qty 1

## 2014-10-07 MED ORDER — EPHEDRINE SULFATE 50 MG/ML IJ SOLN
INTRAMUSCULAR | Status: DC | PRN
  Administered 2014-10-07 (×2): 10 mg via INTRAVENOUS

## 2014-10-07 MED ORDER — KETOROLAC TROMETHAMINE 30 MG/ML IJ SOLN
INTRAMUSCULAR | Status: DC | PRN
  Administered 2014-10-07: 30 mg via INTRAVENOUS

## 2014-10-07 MED ORDER — FENTANYL CITRATE (PF) 100 MCG/2ML IJ SOLN
INTRAMUSCULAR | Status: DC | PRN
  Administered 2014-10-07: 50 ug via INTRAVENOUS

## 2014-10-07 MED ORDER — SODIUM CHLORIDE 0.9 % IR SOLN
Status: DC | PRN
  Administered 2014-10-07: 3000 mL

## 2014-10-07 MED ORDER — ONDANSETRON HCL 4 MG/2ML IJ SOLN
INTRAMUSCULAR | Status: DC | PRN
  Administered 2014-10-07: 4 mg via INTRAVENOUS

## 2014-10-07 MED ORDER — MIDAZOLAM HCL 5 MG/5ML IJ SOLN
INTRAMUSCULAR | Status: DC | PRN
  Administered 2014-10-07: 2 mg via INTRAVENOUS

## 2014-10-07 MED ORDER — VASOPRESSIN 20 UNIT/ML IV SOLN
INTRAVENOUS | Status: DC | PRN
  Administered 2014-10-07: 7 mL via INTRAMUSCULAR

## 2014-10-07 MED ORDER — PROPOFOL 10 MG/ML IV BOLUS
INTRAVENOUS | Status: DC | PRN
  Administered 2014-10-07: 170 mg via INTRAVENOUS

## 2014-10-07 SURGICAL SUPPLY — 43 items
CANISTER SUCTION 2500CC (MISCELLANEOUS) ×2 IMPLANT
CANNULA CURETTE W/SYR 6 (CANNULA) IMPLANT
CANNULA CURETTE W/SYR 7 (CANNULA) IMPLANT
CATH HSG 5FRX28CM (CATHETERS) IMPLANT
CATH INTRA ACCESS BALLN (BALLOONS) IMPLANT
CATH ROBINSON RED A/P 16FR (CATHETERS) ×2 IMPLANT
CATH SALPINGOGRAPHY SELECT (BALLOONS) IMPLANT
CATH SSG INJECTION W/GUIDEWIRE (BALLOONS) IMPLANT
CORD ACTIVE DISPOSABLE (ELECTRODE)
CORD ELECTRO ACTIVE DISP (ELECTRODE) IMPLANT
COVER BACK TABLE 60X90IN (DRAPES) ×2 IMPLANT
DRAPE LG THREE QUARTER DISP (DRAPES) ×4 IMPLANT
ELECT LOOP GYNE PRO 24FR (CUTTING LOOP)
ELECT REM PT RETURN 9FT ADLT (ELECTROSURGICAL)
ELECT VAPORTRODE GRVD BAR (ELECTRODE) IMPLANT
ELECTRODE KNIFE SHAPED 22FR (ELECTROSURGICAL) IMPLANT
ELECTRODE LOOP GYNE PRO 24FR (CUTTING LOOP) IMPLANT
ELECTRODE REM PT RTRN 9FT ADLT (ELECTROSURGICAL) IMPLANT
GLOVE BIO SURGEON STRL SZ 6.5 (GLOVE) ×2 IMPLANT
GLOVE BIO SURGEON STRL SZ8 (GLOVE) ×2 IMPLANT
GLOVE BIOGEL PI IND STRL 8.5 (GLOVE) ×1 IMPLANT
GLOVE BIOGEL PI INDICATOR 8.5 (GLOVE) ×1
GLOVE INDICATOR 6.5 STRL GRN (GLOVE) ×2 IMPLANT
GOWN STRL REUS W/ TWL LRG LVL3 (GOWN DISPOSABLE) ×1 IMPLANT
GOWN STRL REUS W/ TWL XL LVL3 (GOWN DISPOSABLE) ×2 IMPLANT
GOWN STRL REUS W/TWL LRG LVL3 (GOWN DISPOSABLE) ×1
GOWN STRL REUS W/TWL XL LVL3 (GOWN DISPOSABLE) ×2
LEGGING LITHOTOMY PAIR STRL (DRAPES) ×2 IMPLANT
LOOP ANGLED CUTTING 22FR (CUTTING LOOP) IMPLANT
MANIFOLD NEPTUNE II (INSTRUMENTS) IMPLANT
NEEDLE SPNL 22GX3.5 QUINCKE BK (NEEDLE) ×2 IMPLANT
PACK BASIN DAY SURGERY FS (CUSTOM PROCEDURE TRAY) ×2 IMPLANT
PAD OB MATERNITY 4.3X12.25 (PERSONAL CARE ITEMS) ×2 IMPLANT
SET IRRIG Y TYPE TUR BLADDER L (SET/KITS/TRAYS/PACK) IMPLANT
STENT BALLN UTERINE 4CM 6FR (STENTS) IMPLANT
SUT SILK 2 0 SH (SUTURE) IMPLANT
SUT SILK 3 0 PS 1 (SUTURE) IMPLANT
SYR 20CC LL (SYRINGE) IMPLANT
SYR 3ML 18GX1 1/2 (SYRINGE) ×2 IMPLANT
TOWEL OR 17X24 6PK STRL BLUE (TOWEL DISPOSABLE) ×4 IMPLANT
TRAY DSU PREP LF (CUSTOM PROCEDURE TRAY) ×2 IMPLANT
TUBE CONNECTING 12X1/4 (SUCTIONS) ×2 IMPLANT
WATER STERILE IRR 500ML POUR (IV SOLUTION) ×2 IMPLANT

## 2014-10-07 NOTE — Anesthesia Procedure Notes (Signed)
Procedure Name: LMA Insertion Date/Time: 10/07/2014 12:07 PM Performed by: Denna Haggard D Pre-anesthesia Checklist: Patient identified, Emergency Drugs available, Suction available and Patient being monitored Patient Re-evaluated:Patient Re-evaluated prior to inductionOxygen Delivery Method: Circle System Utilized Preoxygenation: Pre-oxygenation with 100% oxygen Intubation Type: IV induction Ventilation: Mask ventilation without difficulty LMA: LMA inserted LMA Size: 4.0 Number of attempts: 1 Airway Equipment and Method: Bite block Placement Confirmation: positive ETCO2 Tube secured with: Tape Dental Injury: Teeth and Oropharynx as per pre-operative assessment

## 2014-10-07 NOTE — Transfer of Care (Signed)
Immediate Anesthesia Transfer of Care Note  Patient: Michele Norman  Procedure(s) Performed: Procedure(s) (LRB): HYSTEROSCOPY WITH metroplasty (N/A)  Patient Location: PACU  Anesthesia Type: General  Level of Consciousness: awake, oriented, sedated and patient cooperative  Airway & Oxygen Therapy: Patient Spontanous Breathing and Patient connected to face mask oxygen  Post-op Assessment: Report given to PACU RN and Post -op Vital signs reviewed and stable  Post vital signs: Reviewed and stable  Complications: No apparent anesthesia complications

## 2014-10-07 NOTE — Anesthesia Postprocedure Evaluation (Signed)
  Anesthesia Post-op Note  Patient: Michele Norman  Procedure(s) Performed: Procedure(s) (LRB): HYSTEROSCOPY WITH metroplasty (N/A)  Patient Location: PACU  Anesthesia Type: General  Level of Consciousness: awake and alert   Airway and Oxygen Therapy: Patient Spontanous Breathing  Post-op Pain: mild  Post-op Assessment: Post-op Vital signs reviewed, Patient's Cardiovascular Status Stable, Respiratory Function Stable, Patent Airway and No signs of Nausea or vomiting  Last Vitals:  Filed Vitals:   10/07/14 1330  BP: 107/53  Pulse: 76  Temp:   Resp: 16    Post-op Vital Signs: stable   Complications: No apparent anesthesia complications

## 2014-10-07 NOTE — Discharge Instructions (Addendum)
Post Anesthesia Home Care Instructions  Activity: Get plenty of rest for the remainder of the day. A responsible adult should stay with you for 24 hours following the procedure.  For the next 24 hours, DO NOT: -Drive a car -Paediatric nurse -Drink alcoholic beverages -Take any medication unless instructed by your physician -Make any legal decisions or sign important papers.  Meals: Start with liquid foods such as gelatin or soup. Progress to regular foods as tolerated. Avoid greasy, spicy, heavy foods. If nausea and/or vomiting occur, drink only clear liquids until the nausea and/or vomiting subsides. Call your physician if vomiting continues.  Special Instructions/Symptoms: Your throat may feel dry or sore from the anesthesia or the breathing tube placed in your throat during surgery. If this causes discomfort, gargle with warm salt water. The discomfort should disappear within 24 hours.  If you had a scopolamine patch placed behind your ear for the management of post- operative nausea and/or vomiting:  1. The medication in the patch is effective for 72 hours, after which it should be removed.  Wrap patch in a tissue and discard in the trash. Wash hands thoroughly with soap and water. 2. You may remove the patch earlier than 72 hours if you experience unpleasant side effects which may include dry mouth, dizziness or visual disturbances. 3. Avoid touching the patch. Wash your hands with soap and water after contact with the patch.   Hysteroscopy, Care After Refer to this sheet in the next few weeks. These instructions provide you with information on caring for yourself after your procedure. Your health care provider may also give you more specific instructions. Your treatment has been planned according to current medical practices, but problems sometimes occur. Call your health care provider if you have any problems or questions after your procedure.  WHAT TO EXPECT AFTER THE  PROCEDURE After your procedure, it is typical to have the following:  You may have some cramping. This normally lasts for a couple days.  You may have bleeding. This can vary from light spotting for a few days to menstrual-like bleeding for 3-7 days. HOME CARE INSTRUCTIONS  Rest for the first 1-2 days after the procedure.  Only take over-the-counter or prescription medicines as directed by your health care provider. Do not take aspirin. It can increase the chances of bleeding.  Take showers instead of baths for 2 weeks or as directed by your health care provider.  Do not drive for 24 hours or as directed.  Do not drink alcohol while taking pain medicine.  Do not use tampons, douche, or have sexual intercourse for 2 weeks or until your health care provider says it is okay.  Take your temperature twice a day for 4-5 days. Write it down each time.  Follow your health care provider's advice about diet, exercise, and lifting.  If you develop constipation, you may:  Take a mild laxative if your health care provider approves.  Add bran foods to your diet.  Drink enough fluids to keep your urine clear or pale yellow.  Try to have someone with you or available to you for the first 24-48 hours, especially if you were given a general anesthetic.  Follow up with your health care provider as directed. SEEK MEDICAL CARE IF:  You feel dizzy or lightheaded.  You feel sick to your stomach (nauseous).  You have abnormal vaginal discharge.  You have a rash.  You have pain that is not controlled with medicine. SEEK IMMEDIATE MEDICAL CARE IF:  You have bleeding that is heavier than a normal menstrual period. °· You have a fever. °· You have increasing cramps or pain, not controlled with medicine. °· You have new belly (abdominal) pain. °· You pass out. °· You have pain in the tops of your shoulders (shoulder strap areas). °· You have shortness of breath. °Document Released: 10/16/2012  Document Reviewed: 10/16/2012 °Exit[employer]® Patient Information ©2015 [employer]. This information is not intended to replace advice given to you by your health care provider. Make sure you discuss any questions you have with your health care provider. ° °

## 2014-10-07 NOTE — Anesthesia Preprocedure Evaluation (Addendum)
Anesthesia Evaluation  Patient identified by MRN, date of birth, ID band Patient awake    Reviewed: Allergy & Precautions, H&P , NPO status , Patient's Chart, lab work & pertinent test results  History of Anesthesia Complications Negative for: history of anesthetic complications  Airway Mallampati: I  TM Distance: >3 FB Neck ROM: full    Dental no notable dental hx.    Pulmonary neg pulmonary ROS,    Pulmonary exam normal breath sounds clear to auscultation       Cardiovascular negative cardio ROS Normal cardiovascular exam Rhythm:regular Rate:Normal     Neuro/Psych Depression negative neurological ROS     GI/Hepatic negative GI ROS, Neg liver ROS,   Endo/Other  negative endocrine ROS  Renal/GU negative Renal ROS     Musculoskeletal   Abdominal   Peds  Hematology negative hematology ROS (+)   Anesthesia Other Findings Uterine septum  Reproductive/Obstetrics negative OB ROS                            Anesthesia Physical Anesthesia Plan  ASA: I  Anesthesia Plan: General   Post-op Pain Management:    Induction: Intravenous  Airway Management Planned: LMA  Additional Equipment:   Intra-op Plan:   Post-operative Plan: Extubation in OR  Informed Consent: I have reviewed the patients History and Physical, chart, labs and discussed the procedure including the risks, benefits and alternatives for the proposed anesthesia with the patient or authorized representative who has indicated his/her understanding and acceptance.   Dental Advisory Given  Plan Discussed with: Anesthesiologist, CRNA and Surgeon  Anesthesia Plan Comments:         Anesthesia Quick Evaluation

## 2014-10-07 NOTE — H&P (Signed)
Michele Norman is a 34 y.o. female , originally referred to me by Dr. Cletis Media, for recurrent pregnancy loss.  She was diagnosed with a uterine septum by recent [facility] and MRI. Patient would like to preserve her childbearing potential.  Pertinent Gynecological History: Menses: normal Bleeding: normal Contraception: none DES exposure: denies Blood transfusions: none Sexually transmitted diseases: no past history Previous GYN Procedures: D&C  Last mammogram: n/a Last pap: normal  OB History: G4 SAB 4   Menstrual History: Menarche age: 77 No LMP recorded.    Past Medical History  Diagnosis Date  . Depression   . History of abnormal cervical Pap smear   . Soy protein intolerance   . Uterus, septate                     Past Surgical History  Procedure Laterality Date  . Wisdom tooth extraction  Feb 2016             Family History  Problem Relation Age of Onset  . Thyroid disease Father   . Heart murmur Brother   . Sickle cell anemia Brother   . Stroke Maternal Grandmother    No hereditary disease.  No cancer of breast, ovary, uterus. No cutaneous leiomyomatosis or renal cell carcinoma.  Social History   Social History  . Marital Status: Single    Spouse Name: N/A  . Number of Children: N/A  . Years of Education: N/A   Occupational History  . Not on file.   Social History Main Topics  . Smoking status: Never Smoker   . Smokeless tobacco: Never Used  . Alcohol Use: No  . Drug Use: No  . Sexual Activity: Yes    Birth Control/ Protection: None   Other Topics Concern  . Not on file   Social History Narrative    No Known Allergies  No current facility-administered medications on file prior to encounter.   No current outpatient prescriptions on file prior to encounter.     Review of Systems  Constitutional: Negative.   HENT: Negative.   Eyes: Negative.   Respiratory: Negative.   Cardiovascular: Negative.   Gastrointestinal: Negative.    Genitourinary: Negative.   Musculoskeletal: Negative.   Skin: Negative.   Neurological: Negative.   Endo/Heme/Allergies: Negative.   Psychiatric/Behavioral: Negative.      Physical Exam  Ht 5\' 2"  (1.575 m)  Wt 52.164 kg (115 lb)  BMI 21.03 kg/m2  LMP 09/26/2014 (Exact Date) Constitutional: She is oriented to person, place, and time. She appears well-developed and well-nourished.  HENT:  Head: Normocephalic and atraumatic.  Nose: Nose normal.  Mouth/Throat: Oropharynx is clear and moist. No oropharyngeal exudate.  Eyes: Conjunctivae normal and EOM are normal. Pupils are equal, round, and reactive to light. No scleral icterus.  Neck: Normal range of motion. Neck supple. No tracheal deviation present. No thyromegaly present.  Cardiovascular: Normal rate.   Respiratory: Effort normal and breath sounds normal.  GI: Soft. Bowel sounds are normal. She exhibits no distension and no mass. There is no tenderness.  Lymphadenopathy:    She has no cervical adenopathy.  Neurological: She is alert and oriented to person, place, and time. She has normal reflexes.  Skin: Skin is warm.  Psychiatric: She has a normal mood and affect. Her behavior is normal. Judgment and thought content normal.       Assessment/Plan:  Patient would like to move forward with hysteroscopy, excision of uterine septum. All of patients questions were  answered.

## 2014-10-07 NOTE — H&P (Signed)
History and Physical Interval Note:  10/07/2014 12:00 PM  Michele Norman  has presented today for surgery, with the diagnosis of uterine septum  The various methods of treatment have been discussed with the patient and family. After consideration of risks, benefits and other options for treatment, the patient has consented to  Procedure(s): HYSTEROSCOPY WITH RESECTION OF UTERINE SEPTUM (N/A) as a surgical intervention .  The patient's history has been reviewed, patient examined, no change in status, stable for surgery.  I have reviewed the patient's chart and labs.  Questions were answered to the patient's satisfaction.     Governor Specking

## 2014-10-07 NOTE — Op Note (Signed)
OPERATIVE NOTE  Preoperative diagnosis: Recurrent pregnancy loss, rule out uterine septum  Postoperative diagnosis: Recurrent pregnancy loss, arcuate uterus  Procedure: Hysteroscopy, metroplasty  Surgeon: Governor Specking  Anesthesia: General  Complications: None  Estimated blood loss: Less than 20 mL  Specimen: None  Findings: Endocervical canal appeared normal. The uterus sounded to 8 cm. Endometrial cavity was of normal size. There was a midline fundal indentation about three quarters with centimeter, creating an arcuate cavity. Both tubal ostia were seen.  Description of procedure: Patient was placed in dorsal supine position. General anesthesia was administered. She was placed in lithotomy position. She was prepped and draped in sterile manner. A vaginal speculum was placed. A dilute vasopressin solution containing 0.33 units per milliliter was injected into the cervical stroma x5 cc. A Slimline hysteroscope with 12 lens was inserted into the canal and above findings were noted. Distention medium was normal saline. Distention method was gravity. Above findings were noted. Because of the patient's history of pregnancy losses, we decided to correct the arcuate configuration of the uterine cavity. Using hysteroscopic scissors the fundal midline indentation was incised until a smooth fundus was created. We made sure not to go above the interostial imaginary line. Hemostasis was insured. Instrument count was correct. Estimated blood loss was less than 20 mL. The patient tolerated the procedure well and was transferred to recovery in satisfactory condition.  Governor Specking, MD

## 2014-10-08 ENCOUNTER — Encounter (HOSPITAL_BASED_OUTPATIENT_CLINIC_OR_DEPARTMENT_OTHER): Payer: Self-pay | Admitting: Obstetrics and Gynecology

## 2015-03-11 DIAGNOSIS — Z124 Encounter for screening for malignant neoplasm of cervix: Secondary | ICD-10-CM | POA: Diagnosis not present

## 2015-03-11 DIAGNOSIS — N6001 Solitary cyst of right breast: Secondary | ICD-10-CM | POA: Diagnosis not present

## 2015-03-11 DIAGNOSIS — Z6820 Body mass index (BMI) 20.0-20.9, adult: Secondary | ICD-10-CM | POA: Diagnosis not present

## 2015-03-11 DIAGNOSIS — Z01419 Encounter for gynecological examination (general) (routine) without abnormal findings: Secondary | ICD-10-CM | POA: Diagnosis not present

## 2015-03-11 DIAGNOSIS — Z113 Encounter for screening for infections with a predominantly sexual mode of transmission: Secondary | ICD-10-CM | POA: Diagnosis not present

## 2015-03-12 ENCOUNTER — Other Ambulatory Visit: Payer: Self-pay | Admitting: Obstetrics and Gynecology

## 2015-03-12 DIAGNOSIS — N63 Unspecified lump in unspecified breast: Secondary | ICD-10-CM

## 2015-03-18 ENCOUNTER — Ambulatory Visit
Admission: RE | Admit: 2015-03-18 | Discharge: 2015-03-18 | Disposition: A | Discharge status: Home / Self Care | Payer: 59 | Source: Ambulatory Visit | Attending: Obstetrics and Gynecology | Admitting: Obstetrics and Gynecology

## 2015-03-18 DIAGNOSIS — N63 Unspecified lump in unspecified breast: Secondary | ICD-10-CM

## 2015-03-18 DIAGNOSIS — N6489 Other specified disorders of breast: Secondary | ICD-10-CM | POA: Diagnosis not present

## 2015-03-18 DIAGNOSIS — R922 Inconclusive mammogram: Secondary | ICD-10-CM | POA: Diagnosis not present

## 2015-07-07 DIAGNOSIS — R399 Unspecified symptoms and signs involving the genitourinary system: Secondary | ICD-10-CM | POA: Diagnosis not present

## 2016-03-27 DIAGNOSIS — Z01419 Encounter for gynecological examination (general) (routine) without abnormal findings: Secondary | ICD-10-CM | POA: Diagnosis not present

## 2016-03-27 DIAGNOSIS — Z681 Body mass index (BMI) 19 or less, adult: Secondary | ICD-10-CM | POA: Diagnosis not present

## 2016-03-27 DIAGNOSIS — Z3040 Encounter for surveillance of contraceptives, unspecified: Secondary | ICD-10-CM | POA: Diagnosis not present

## 2016-03-27 DIAGNOSIS — Z113 Encounter for screening for infections with a predominantly sexual mode of transmission: Secondary | ICD-10-CM | POA: Diagnosis not present

## 2016-03-27 DIAGNOSIS — Z124 Encounter for screening for malignant neoplasm of cervix: Secondary | ICD-10-CM | POA: Diagnosis not present

## 2016-06-07 ENCOUNTER — Inpatient Hospital Stay (HOSPITAL_COMMUNITY)
Admission: AD | Admit: 2016-06-07 | Discharge: 2016-06-07 | Disposition: A | Discharge status: Home / Self Care | Payer: 59 | Source: Ambulatory Visit | Attending: Obstetrics and Gynecology | Admitting: Obstetrics and Gynecology

## 2016-06-07 ENCOUNTER — Encounter (HOSPITAL_COMMUNITY): Payer: Self-pay | Admitting: *Deleted

## 2016-06-07 DIAGNOSIS — R109 Unspecified abdominal pain: Secondary | ICD-10-CM | POA: Insufficient documentation

## 2016-06-07 DIAGNOSIS — Z9889 Other specified postprocedural states: Secondary | ICD-10-CM | POA: Insufficient documentation

## 2016-06-07 DIAGNOSIS — R102 Pelvic and perineal pain: Secondary | ICD-10-CM

## 2016-06-07 DIAGNOSIS — Z8249 Family history of ischemic heart disease and other diseases of the circulatory system: Secondary | ICD-10-CM | POA: Diagnosis not present

## 2016-06-07 DIAGNOSIS — F329 Major depressive disorder, single episode, unspecified: Secondary | ICD-10-CM | POA: Diagnosis not present

## 2016-06-07 DIAGNOSIS — B9689 Other specified bacterial agents as the cause of diseases classified elsewhere: Secondary | ICD-10-CM

## 2016-06-07 DIAGNOSIS — N939 Abnormal uterine and vaginal bleeding, unspecified: Secondary | ICD-10-CM

## 2016-06-07 DIAGNOSIS — Z79899 Other long term (current) drug therapy: Secondary | ICD-10-CM | POA: Diagnosis not present

## 2016-06-07 DIAGNOSIS — N760 Acute vaginitis: Secondary | ICD-10-CM | POA: Insufficient documentation

## 2016-06-07 DIAGNOSIS — Z823 Family history of stroke: Secondary | ICD-10-CM | POA: Insufficient documentation

## 2016-06-07 HISTORY — DX: Unspecified ovarian cyst, unspecified side: N83.209

## 2016-06-07 HISTORY — DX: Benign neoplasm of connective and other soft tissue, unspecified: D21.9

## 2016-06-07 LAB — URINALYSIS, ROUTINE W REFLEX MICROSCOPIC
BILIRUBIN URINE: NEGATIVE
Glucose, UA: NEGATIVE mg/dL
Hgb urine dipstick: NEGATIVE
Ketones, ur: NEGATIVE mg/dL
LEUKOCYTES UA: NEGATIVE
NITRITE: NEGATIVE
PH: 6 (ref 5.0–8.0)
Protein, ur: NEGATIVE mg/dL
SPECIFIC GRAVITY, URINE: 1.015 (ref 1.005–1.030)

## 2016-06-07 LAB — WET PREP, GENITAL
SPERM: NONE SEEN
TRICH WET PREP: NONE SEEN
YEAST WET PREP: NONE SEEN

## 2016-06-07 LAB — POCT PREGNANCY, URINE: PREG TEST UR: NEGATIVE

## 2016-06-07 MED ORDER — METRONIDAZOLE 500 MG PO TABS: 500.0000 mg | ORAL_TABLET | Freq: Two times a day (BID) | ORAL | 0 refills | Status: AC

## 2016-06-07 NOTE — MAU Note (Signed)
Chief Complaint: Pt had some bleeding after intercourse last night, no further bleeding but having mild right sided pain.  First Provider Initiated Contact with Patient 06/07/16 2110     SUBJECTIVE HPI: Michele Norman is a 36 y.o. G3P0030 at Unknown who presents to [facility] reporting mild right sided pain.  Pt LMP 5/14 normal cycle.  Had intercourse 24 hours ago with bleeding noted.  No further bleeding today.  Having right sided pelvic pain.  Pt does not use birth control.  Same partner for 11 years.  No concerns of STDs. Had testing 2 months ago.  Denies pain with urination or constipation issues.  Location: right side Quality: pressure Severity: 4/10 on pain scale Duration: 24 Context: with moving Timing: after intercourse Modifying factors: Has not taken any otc for discomfort Associated signs and symptoms: history in past of ovarian cyst  Past Medical History:  Diagnosis Date  . Depression   . Fibroid   . History of abnormal cervical Pap smear   . Ovarian cyst   . Soy protein intolerance   . Uterus, septate    OB History  Gravida Para Term Preterm AB Living  3       3 0  SAB TAB Ectopic Multiple Live Births  2 1          # Outcome Date GA Lbr Len/2nd Weight Sex Delivery Anes PTL Lv  3 SAB 2015             Birth Comments: System Generated. Please review and update pregnancy details.  2 TAB 2003          1 SAB              Past Surgical History:  Procedure Laterality Date  . HYSTEROSCOPY N/A 10/07/2014   Procedure: HYSTEROSCOPY WITH metroplasty;  Surgeon: Governor Specking, MD;  Location: [facility];  Service: Gynecology;  Laterality: N/A;  . WISDOM TOOTH EXTRACTION  Feb 2016   Social History   Social History  . Marital status: Single    Spouse name: N/A  . Number of children: N/A  . Years of education: N/A   Occupational History  . Not on file.   Social History Main Topics  . Smoking status: Never Smoker  . Smokeless tobacco: Never  Used  . Alcohol use No  . Drug use: No  . Sexual activity: Yes    Birth control/ protection: None   Other Topics Concern  . Not on file   Social History Narrative  . No narrative on file   Family History  Problem Relation Age of Onset  . Thyroid disease Father   . Heart murmur Brother   . Stroke Maternal Grandmother   . Sickle cell anemia Brother    No current facility-administered medications on file prior to encounter.    Current Outpatient Prescriptions on File Prior to Encounter  Medication Sig Dispense Refill  . ibuprofen (ADVIL,MOTRIN) 200 MG tablet Take 200 mg by mouth every 6 (six) hours as needed.     No Known Allergies  I have reviewed patient's Past Medical Hx, Surgical Hx, Family Hx, Social Hx, medications and allergies.   Review of Systems  Constitutional: Negative.   HENT: Negative.   Eyes: Negative.   Respiratory: Negative.   Cardiovascular: Negative.   Gastrointestinal: Positive for abdominal pain.  Genitourinary: Negative.   Musculoskeletal: Negative.   Skin: Negative.    Review of Systems  Constitutional: Negative.   HENT: Negative.  Eyes: Negative.   Respiratory: Negative.   Cardiovascular: Negative.   Gastrointestinal: Positive for abdominal pain.  Genitourinary: Negative.   Musculoskeletal: Negative.   Skin: Negative.      OBJECTIVE Patient Vitals for the past 24 hrs:  BP Temp Pulse Resp SpO2 Height Weight  06/07/16 2053 114/77 98.8 F (37.1 C) 74 16 98 % - -  06/07/16 2039 - - - - - 5' 1.5" (1.562 m) 50.8 kg (112 lb)   Constitutional: Well-developed, well-nourished female in no acute distress.  Cardiovascular: normal rate Respiratory: normal rate and effort.  GI: Abd soft, non-tender, gravid appropriate for gestational age. Pos BS x 4 MS: Extremities nontender, no edema, normal ROM Neurologic: Alert and oriented x 4.  GU: Neg CVAT.  SPECULUM EXAM: NEFG, physiologic discharge, no blood noted, cervix clean  BIMANUAL: cervix wnl,  uterus normal size, no adnexal tenderness or masses.  No CMT.  LAB RESULTS Results for orders placed or performed during the hospital encounter of 06/07/16 (from the past 24 hour(s))  Urinalysis, Routine w reflex microscopic     Status: None   Collection Time: 06/07/16  8:35 PM  Result Value Ref Range   Color, Urine YELLOW YELLOW   APPearance CLEAR CLEAR   Specific Gravity, Urine 1.015 1.005 - 1.030   pH 6.0 5.0 - 8.0   Glucose, UA NEGATIVE NEGATIVE mg/dL   Hgb urine dipstick NEGATIVE NEGATIVE   Bilirubin Urine NEGATIVE NEGATIVE   Ketones, ur NEGATIVE NEGATIVE mg/dL   Protein, ur NEGATIVE NEGATIVE mg/dL   Nitrite NEGATIVE NEGATIVE   Leukocytes, UA NEGATIVE NEGATIVE  Pregnancy, urine POC     Status: None   Collection Time: 06/07/16  8:47 PM  Result Value Ref Range   Preg Test, Ur NEGATIVE NEGATIVE   Wet prep positive for clue celss IMAGING No results found.  MAU COURSE Orders Placed This Encounter  Procedures  . Wet prep, genital  . Urinalysis, Routine w reflex microscopic  . Pregnancy, urine POC   No orders of the defined types were placed in this encounter.   MDM Wet mount GC/Chlamydia ASSESSMENT Right sided pelvic pain AUB BV  PLAN Discharge home in stable condition.  If pain continues will do pelvic US in office. Offered Motrin pt declined.  Discussed otc pain options. Escribed Flagyl 500mg  BID x 7 days     Starla Link, CNM 06/07/2016  9:25 PM

## 2016-06-07 NOTE — MAU Note (Signed)
Pt reports she had a lot of bleeding during intercourse last night. States she also started having some pain afterward but not during intercourse. She has not had any further bleeding today but has continued to have pain and a throbbing in her right lower abd.

## 2016-06-09 LAB — GC/CHLAMYDIA PROBE AMP (~~LOC~~) NOT AT ARMC
Chlamydia: NEGATIVE
Neisseria Gonorrhea: NEGATIVE

## 2017-04-10 DIAGNOSIS — Z01419 Encounter for gynecological examination (general) (routine) without abnormal findings: Secondary | ICD-10-CM | POA: Diagnosis not present

## 2017-04-10 DIAGNOSIS — Z6820 Body mass index (BMI) 20.0-20.9, adult: Secondary | ICD-10-CM | POA: Diagnosis not present

## 2017-04-10 DIAGNOSIS — Z3040 Encounter for surveillance of contraceptives, unspecified: Secondary | ICD-10-CM | POA: Diagnosis not present

## 2017-04-10 DIAGNOSIS — Z113 Encounter for screening for infections with a predominantly sexual mode of transmission: Secondary | ICD-10-CM | POA: Diagnosis not present

## 2017-04-25 DIAGNOSIS — N979 Female infertility, unspecified: Secondary | ICD-10-CM | POA: Diagnosis not present

## 2017-05-10 DIAGNOSIS — N970 Female infertility associated with anovulation: Secondary | ICD-10-CM | POA: Diagnosis not present

## 2017-05-10 DIAGNOSIS — N979 Female infertility, unspecified: Secondary | ICD-10-CM | POA: Diagnosis not present

## 2017-05-10 DIAGNOSIS — Q51810 Arcuate uterus: Secondary | ICD-10-CM | POA: Insufficient documentation

## 2017-07-03 ENCOUNTER — Ambulatory Visit: Payer: Self-pay | Admitting: Nurse Practitioner

## 2017-07-03 VITALS — BP 92/60 | HR 75 | Temp 98.3°F | Resp 16 | Wt 114.8 lb

## 2017-07-03 DIAGNOSIS — Z0000 Encounter for general adult medical examination without abnormal findings: Secondary | ICD-10-CM

## 2017-07-03 NOTE — Patient Instructions (Addendum)
Health Maintenance, Female Adopting a healthy lifestyle and getting preventive care can go a long way to promote health and wellness. Talk with your health care provider about what schedule of regular examinations is right for you. This is a good chance for you to check in with your provider about disease prevention and staying healthy. In between checkups, there are plenty of things you can do on your own. Experts have done a lot of research about which lifestyle changes and preventive measures are most likely to keep you healthy. Ask your health care provider for more information. Weight and diet Eat a healthy diet  Be sure to include plenty of vegetables, fruits, low-fat dairy products, and lean protein.  Do not eat a lot of foods high in solid fats, added sugars, or salt.  Get regular exercise. This is one of the most important things you can do for your health. ? Most adults should exercise for at least 150 minutes each week. The exercise should increase your heart rate and make you sweat (moderate-intensity exercise). ? Most adults should also do strengthening exercises at least twice a week. This is in addition to the moderate-intensity exercise.  Maintain a healthy weight  Body mass index (BMI) is a measurement that can be used to identify possible weight problems. It estimates body fat based on height and weight. Your health care provider can help determine your BMI and help you achieve or maintain a healthy weight.  For females 20 years of age and older: ? A BMI below 18.5 is considered underweight. ? A BMI of 18.5 to 24.9 is normal. ? A BMI of 25 to 29.9 is considered overweight. ? A BMI of 30 and above is considered obese.  Watch levels of cholesterol and blood lipids  You should start having your blood tested for lipids and cholesterol at 35 years of age, then have this test every 5 years.  You may need to have your cholesterol levels checked more often if: ? Your lipid or  cholesterol levels are high. ? You are older than 35 years of age. ? You are at high risk for heart disease.  Cancer screening Lung Cancer  Lung cancer screening is recommended for adults 55-80 years old who are at high risk for lung cancer because of a history of smoking.  A yearly low-dose CT scan of the lungs is recommended for people who: ? Currently smoke. ? Have quit within the past 15 years. ? Have at least a 30-pack-year history of smoking. A pack year is smoking an average of one pack of cigarettes a day for 1 year.  Yearly screening should continue until it has been 15 years since you quit.  Yearly screening should stop if you develop a health problem that would prevent you from having lung cancer treatment.  Breast Cancer  Practice breast self-awareness. This means understanding how your breasts normally appear and feel.  It also means doing regular breast self-exams. Let your health care provider know about any changes, no matter how small.  If you are in your 20s or 30s, you should have a clinical breast exam (CBE) by a health care provider every 1-3 years as part of a regular health exam.  If you are 40 or older, have a CBE every year. Also consider having a breast X-ray (mammogram) every year.  If you have a family history of breast cancer, talk to your health care provider about genetic screening.  If you are at high risk   for breast cancer, talk to your health care provider about having an MRI and a mammogram every year.  Breast cancer gene (BRCA) assessment is recommended for women who have family members with BRCA-related cancers. BRCA-related cancers include: ? Breast. ? Ovarian. ? Tubal. ? Peritoneal cancers.  Results of the assessment will determine the need for genetic counseling and BRCA1 and BRCA2 testing.  Cervical Cancer Your health care provider may recommend that you be screened regularly for cancer of the pelvic organs (ovaries, uterus, and  vagina). This screening involves a pelvic examination, including checking for microscopic changes to the surface of your cervix (Pap test). You may be encouraged to have this screening done every 3 years, beginning at age 22.  For women ages 56-65, health care providers may recommend pelvic exams and Pap testing every 3 years, or they may recommend the Pap and pelvic exam, combined with testing for human papilloma virus (HPV), every 5 years. Some types of HPV increase your risk of cervical cancer. Testing for HPV may also be done on women of any age with unclear Pap test results.  Other health care providers may not recommend any screening for nonpregnant women who are considered low risk for pelvic cancer and who do not have symptoms. Ask your health care provider if a screening pelvic exam is right for you.  If you have had past treatment for cervical cancer or a condition that could lead to cancer, you need Pap tests and screening for cancer for at least 20 years after your treatment. If Pap tests have been discontinued, your risk factors (such as having a new sexual partner) need to be reassessed to determine if screening should resume. Some women have medical problems that increase the chance of getting cervical cancer. In these cases, your health care provider may recommend more frequent screening and Pap tests.  Colorectal Cancer  This type of cancer can be detected and often prevented.  Routine colorectal cancer screening usually begins at 35 years of age and continues through 35 years of age.  Your health care provider may recommend screening at an earlier age if you have risk factors for colon cancer.  Your health care provider may also recommend using home test kits to check for hidden blood in the stool.  A small camera at the end of a tube can be used to examine your colon directly (sigmoidoscopy or colonoscopy). This is done to check for the earliest forms of colorectal  cancer.  Routine screening usually begins at age 33.  Direct examination of the colon should be repeated every 5-10 years through 35 years of age. However, you may need to be screened more often if early forms of precancerous polyps or small growths are found.  Skin Cancer  Check your skin from head to toe regularly.  Tell your health care provider about any new moles or changes in moles, especially if there is a change in a mole's shape or color.  Also tell your health care provider if you have a mole that is larger than the size of a pencil eraser.  Always use sunscreen. Apply sunscreen liberally and repeatedly throughout the day.  Protect yourself by wearing long sleeves, pants, a wide-brimmed hat, and sunglasses whenever you are outside.  Heart disease, diabetes, and high blood pressure  High blood pressure causes heart disease and increases the risk of stroke. High blood pressure is more likely to develop in: ? People who have blood pressure in the high end of  the normal range (130-139/85-89 mm Hg). ? People who are overweight or obese. ? People who are African American.  If you are 21-29 years of age, have your blood pressure checked every 3-5 years. If you are 3 years of age or older, have your blood pressure checked every year. You should have your blood pressure measured twice-once when you are at a hospital or clinic, and once when you are not at a hospital or clinic. Record the average of the two measurements. To check your blood pressure when you are not at a hospital or clinic, you can use: ? An automated blood pressure machine at a pharmacy. ? A home blood pressure monitor.  If you are between 17 years and 37 years old, ask your health care provider if you should take aspirin to prevent strokes.  Have regular diabetes screenings. This involves taking a blood sample to check your fasting blood sugar level. ? If you are at a normal weight and have a low risk for diabetes,  have this test once every three years after 35 years of age. ? If you are overweight and have a high risk for diabetes, consider being tested at a younger age or more often. Preventing infection Hepatitis B  If you have a higher risk for hepatitis B, you should be screened for this virus. You are considered at high risk for hepatitis B if: ? You were born in a country where hepatitis B is common. Ask your health care provider which countries are considered high risk. ? Your parents were born in a high-risk country, and you have not been immunized against hepatitis B (hepatitis B vaccine). ? You have HIV or AIDS. ? You use needles to inject street drugs. ? You live with someone who has hepatitis B. ? You have had sex with someone who has hepatitis B. ? You get hemodialysis treatment. ? You take certain medicines for conditions, including cancer, organ transplantation, and autoimmune conditions.  Hepatitis C  Blood testing is recommended for: ? Everyone born from [birth year] through 1963. ? Anyone with known risk factors for hepatitis C.  Sexually transmitted infections (STIs)  You should be screened for sexually transmitted infections (STIs) including gonorrhea and chlamydia if: ? You are sexually active and are younger than 35 years of age. ? You are older than 36 years of age and your health care provider tells you that you are at risk for this type of infection. ? Your sexual activity has changed since you were last screened and you are at an increased risk for chlamydia or gonorrhea. Ask your health care provider if you are at risk.  If you do not have HIV, but are at risk, it may be recommended that you take a prescription medicine daily to prevent HIV infection. This is called pre-exposure prophylaxis (PrEP). You are considered at risk if: ? You are sexually active and do not regularly use condoms or know the HIV status of your partner(s). ? You take drugs by injection. ? You are  sexually active with a partner who has HIV.  Talk with your health care provider about whether you are at high risk of being infected with HIV. If you choose to begin PrEP, you should first be tested for HIV. You should then be tested every 3 months for as long as you are taking PrEP. Pregnancy  If you are premenopausal and you may become pregnant, ask your health care provider about preconception counseling.  If you may become  pregnant, take 400 to 800 micrograms (mcg) of folic acid every day.  If you want to prevent pregnancy, talk to your health care provider about birth control (contraception). Osteoporosis and menopause  Osteoporosis is a disease in which the bones lose minerals and strength with aging. This can result in serious bone fractures. Your risk for osteoporosis can be identified using a bone density scan.  If you are 65 years of age or older, or if you are at risk for osteoporosis and fractures, ask your health care provider if you should be screened.  Ask your health care provider whether you should take a calcium or vitamin D supplement to lower your risk for osteoporosis.  Menopause may have certain physical symptoms and risks.  Hormone replacement therapy may reduce some of these symptoms and risks. Talk to your health care provider about whether hormone replacement therapy is right for you. Follow these instructions at home:  Schedule regular health, dental, and eye exams.  Stay current with your immunizations.  Do not use any tobacco products including cigarettes, chewing tobacco, or electronic cigarettes.  If you are pregnant, do not drink alcohol.  If you are breastfeeding, limit how much and how often you drink alcohol.  Limit alcohol intake to no more than 1 drink per day for nonpregnant women. One drink equals 12 ounces of beer, 5 ounces of wine, or 1 ounces of hard liquor.  Do not use street drugs.  Do not share needles.  Ask your health care  provider for help if you need support or information about quitting drugs.  Tell your health care provider if you often feel depressed.  Tell your health care provider if you have ever been abused or do not feel safe at home. This information is not intended to replace advice given to you by your health care provider. Make sure you discuss any questions you have with your health care provider. Document Released: 07/11/2010 Document Revised: 06/03/2015 Document Reviewed: 09/29/2014 Elsevier Interactive Patient Education  2018 [employer]  Preventive Care 18-39 Years, Female Preventive care refers to lifestyle choices and visits with your health care provider that can promote health and wellness. What does preventive care include?  A yearly physical exam. This is also called an annual well check.  Dental exams once or twice a year.  Routine eye exams. Ask your health care provider how often you should have your eyes checked.  Personal lifestyle choices, including: ? Daily care of your teeth and gums. ? Regular physical activity. ? Eating a healthy diet. ? Avoiding tobacco and drug use. ? Limiting alcohol use. ? Practicing safe sex. ? Taking vitamin and mineral supplements as recommended by your health care provider. What happens during an annual well check? The services and screenings done by your health care provider during your annual well check will depend on your age, overall health, lifestyle risk factors, and family history of disease. Counseling Your health care provider may ask you questions about your:  Alcohol use.  Tobacco use.  Drug use.  Emotional well-being.  Home and relationship well-being.  Sexual activity.  Eating habits.  Work and work environment.  Method of birth control.  Menstrual cycle.  Pregnancy history.  Screening You may have the following tests or measurements:  Height, weight, and BMI.  Diabetes screening. This is done by  checking your blood sugar (glucose) after you have not eaten for a while (fasting).  Blood pressure.  Lipid and cholesterol levels. These may be checked   every 5 years starting at age 70.  Skin check.  Hepatitis C blood test.  Hepatitis B blood test.  Sexually transmitted disease (STD) testing.  BRCA-related cancer screening. This may be done if you have a family history of breast, ovarian, tubal, or peritoneal cancers.  Pelvic exam and Pap test. This may be done every 3 years starting at age 6. Starting at age 57, this may be done every 5 years if you have a Pap test in combination with an HPV test.  Discuss your test results, treatment options, and if necessary, the need for more tests with your health care provider. Vaccines Your health care provider may recommend certain vaccines, such as:  Influenza vaccine. This is recommended every year.  Tetanus, diphtheria, and acellular pertussis (Tdap, Td) vaccine. You may need a Td booster every 10 years.  Varicella vaccine. You may need this if you have not been vaccinated.  HPV vaccine. If you are 30 or younger, you may need three doses over 6 months.  Measles, mumps, and rubella (MMR) vaccine. You may need at least one dose of MMR. You may also need a second dose.  Pneumococcal 13-valent conjugate (PCV13) vaccine. You may need this if you have certain conditions and were not previously vaccinated.  Pneumococcal polysaccharide (PPSV23) vaccine. You may need one or two doses if you smoke cigarettes or if you have certain conditions.  Meningococcal vaccine. One dose is recommended if you are age 50-21 years and a first-year college student living in a residence hall, or if you have one of several medical conditions. You may also need additional booster doses.  Hepatitis A vaccine. You may need this if you have certain conditions or if you travel or work in places where you may be exposed to hepatitis A.  Hepatitis B vaccine. You  may need this if you have certain conditions or if you travel or work in places where you may be exposed to hepatitis B.  Haemophilus influenzae type b (Hib) vaccine. You may need this if you have certain risk factors.  Talk to your health care provider about which screenings and vaccines you need and how often you need them. This information is not intended to replace advice given to you by your health care provider. Make sure you discuss any questions you have with your health care provider. Document Released: 02/21/2001 Document Revised: 09/15/2015 Document Reviewed: 10/27/2014 Elsevier Interactive Patient Education  2018 [employer].  Preventing Hypertension Hypertension, commonly called high blood pressure, is when the force of blood pumping through the arteries is too strong. Arteries are blood vessels that carry blood from the heart throughout the body. Over time, hypertension can damage the arteries and decrease blood flow to important parts of the body, including the brain, heart, and kidneys. Often, hypertension does not cause symptoms until blood pressure is very high. For this reason, it is important to have your blood pressure checked on a regular basis. Hypertension can often be prevented with diet and lifestyle changes. If you already have hypertension, you can control it with diet and lifestyle changes, as well as medicine. What nutrition changes can be made? Maintain a healthy diet. This includes:  Eating less salt (sodium). Ask your health care provider how much sodium is safe for you to have. The general recommendation is to consume less than 1 tsp (2,300 mg) of sodium a day. ? Do not add salt to your food. ? Choose low-sodium options when grocery shopping and eating out.  Limiting fats in your diet. You can do this by eating low-fat or fat-free dairy products and by eating less red meat.  Eating more fruits, vegetables, and whole grains. Make a goal to eat: ? 1-2 cups of  fresh fruits and vegetables each day. ? 3-4 servings of whole grains each day.  Avoiding foods and beverages that have added sugars.  Eating fish that contain healthy fats (omega-3 fatty acids), such as mackerel or salmon.  If you need help putting together a healthy eating plan, try the DASH diet. This diet is high in fruits, vegetables, and whole grains. It is low in sodium, red meat, and added sugars. DASH stands for Dietary Approaches to Stop Hypertension. What lifestyle changes can be made?  Lose weight if you are overweight. Losing just 3?5% of your body weight can help prevent or control hypertension. ? For example, if your present weight is 200 lb (91 kg), a loss of 3-5% of your weight means losing 6-10 lb (2.7-4.5 kg). ? Ask your health care provider to help you with a diet and exercise plan to safely lose weight.  Get enough exercise. Do at least 150 minutes of moderate-intensity exercise each week. ? You could do this in short exercise sessions several times a day, or you could do longer exercise sessions a few times a week. For example, you could take a brisk 10-minute walk or bike ride, 3 times a day, for 5 days a week.  Find ways to reduce stress, such as exercising, meditating, listening to music, or taking a yoga class. If you need help reducing stress, ask your health care provider.  Do not smoke. This includes e-cigarettes. Chemicals in tobacco and nicotine products raise your blood pressure each time you smoke. If you need help quitting, ask your health care provider.  Avoid alcohol. If you drink alcohol, limit alcohol intake to no more than 1 drink a day for nonpregnant women and 2 drinks a day for men. One drink equals 12 oz of beer, 5 oz of wine, or 1 oz of hard liquor. Why are these changes important? Diet and lifestyle changes can help you prevent hypertension, and they may make you feel better overall and improve your quality of life. If you have hypertension, making  these changes will help you control it and help prevent major complications, such as:  Hardening and narrowing of arteries that supply blood to: ? Your heart. This can cause a heart attack. ? Your brain. This can cause a stroke. ? Your kidneys. This can cause kidney failure.  Stress on your heart muscle, which can cause heart failure.  What can I do to lower my risk?  Work with your health care provider to make a hypertension prevention plan that works for you. Follow your plan and keep all follow-up visits as told by your health care provider.  Learn how to check your blood pressure at home. Make sure that you know your personal target blood pressure, as told by your health care provider. How is this treated? In addition to diet and lifestyle changes, your health care provider may recommend medicines to help lower your blood pressure. You may need to try a few different medicines to find what works best for you. You also may need to take more than one medicine. Take over-the-counter and prescription medicines only as told by your health care provider. Where to find support: Your health care provider can help you prevent hypertension and help you keep your blood  pressure at a healthy level. Your local hospital or your community may also provide support services and prevention programs. The American Heart Association offers an online support network at: CheapBootlegs.com.cy Where to find more information: Learn more about hypertension from:  National Heart, Lung, and Blood Institute: ElectronicHangman.is  Centers for Disease Control and Prevention: https://ingram.com/  American Academy of Family Physicians: http://familydoctor.org/familydoctor/en/diseases-conditions/high-blood-pressure.printerview.all.html  Learn more about the DASH diet from:  [place], Lung, and [place]:  https://www.reyes.com/  Contact a health care provider if:  You think you are having a reaction to medicines you have taken.  You have recurrent headaches or feel dizzy.  You have swelling in your ankles.  You have trouble with your vision. Summary  Hypertension often does not cause any symptoms until blood pressure is very high. It is important to get your blood pressure checked regularly.  Diet and lifestyle changes are the most important steps in preventing hypertension.  By keeping your blood pressure in a healthy range, you can prevent complications like heart attack, heart failure, stroke, and kidney failure.  Work with your health care provider to make a hypertension prevention plan that works for you. This information is not intended to replace advice given to you by your health care provider. Make sure you discuss any questions you have with your health care provider. Document Released: 01/10/2015 Document Revised: 09/06/2015 Document Reviewed: 09/06/2015 Elsevier Interactive Patient Education  2018 [employer].  Preventing Type 2 Diabetes Mellitus Type 2 diabetes (type 2 diabetes mellitus) is a long-term (chronic) disease that affects blood sugar (glucose) levels. Normally, a hormone called insulin allows glucose to enter cells in the body. The cells use glucose for energy. In type 2 diabetes, one or both of these problems may be present:  The body does not make enough insulin.  The body does not respond properly to insulin that it makes (insulin resistance).  Insulin resistance or lack of insulin causes excess glucose to build up in the blood instead of going into cells. As a result, high blood glucose (hyperglycemia) develops, which can cause many complications. Being overweight or obese and having an inactive (sedentary) lifestyle can increase your risk for diabetes. Type 2 diabetes can be delayed or prevented by making certain nutrition and  lifestyle changes. What nutrition changes can be made?  Eat healthy meals and snacks regularly. Keep a healthy snack with you for when you get hungry between meals, such as fruit or a handful of nuts.  Eat lean meats and proteins that are low in saturated fats, such as chicken, fish, egg whites, and beans. Avoid processed meats.  Eat plenty of fruits and vegetables and plenty of grains that have not been processed (whole grains). It is recommended that you eat: ? 1?2 cups of fruit every day. ? 2?3 cups of vegetables every day. ? 6?8 oz of whole grains every day, such as oats, whole wheat, bulgur, brown rice, quinoa, and millet.  Eat low-fat dairy products, such as milk, yogurt, and cheese.  Eat foods that contain healthy fats, such as nuts, avocado, olive oil, and canola oil.  Drink water throughout the day. Avoid drinks that contain added sugar, such as soda or sweet tea.  Follow instructions from your health care provider about specific eating or drinking restrictions.  Control how much food you eat at a time (portion size). ? Check food labels to find out the serving sizes of foods. ? Use a kitchen scale to weigh amounts of foods.  Saute or  steam food instead of frying it. Cook with water or broth instead of oils or butter.  Limit your intake of: ? Salt (sodium). Have no more than 1 tsp (2,400 mg) of sodium a day. If you have heart disease or high blood pressure, have less than ? tsp (1,500 mg) of sodium a day. ? Saturated fat. This is fat that is solid at room temperature, such as butter or fat on meat. What lifestyle changes can be made?  Activity  Do moderate-intensity physical activity for at least 30 minutes on at least 5 days of the week, or as much as told by your health care provider.  Ask your health care provider what activities are safe for you. A mix of physical activities may be best, such as walking, swimming, cycling, and strength training.  Try to add  physical activity into your day. For example: ? Park in spots that are farther away than usual, so that you walk more. For example, park in a far corner of the parking lot when you go to the office or the grocery store. ? Take a walk during your lunch break. ? Use stairs instead of elevators or escalators. Weight Loss  Lose weight as directed. Your health care provider can determine how much weight loss is best for you and can help you lose weight safely.  If you are overweight or obese, you may be instructed to lose at least 5?7 % of your body weight. Alcohol and Tobacco   Limit alcohol intake to no more than 1 drink a day for nonpregnant women and 2 drinks a day for men. One drink equals 12 oz of beer, 5 oz of wine, or 1 oz of hard liquor.  Do not use any tobacco products, such as cigarettes, chewing tobacco, and e-cigarettes. If you need help quitting, ask your health care provider. Work With [facility] Provider  Have your blood glucose tested regularly, as told by your health care provider.  Discuss your risk factors and how you can reduce your risk for diabetes.  Get screening tests as told by your health care provider. You may have screening tests regularly, especially if you have certain risk factors for type 2 diabetes.  Make an appointment with a diet and nutrition specialist (registered dietitian). A registered dietitian can help you make a healthy eating plan and can help you understand portion sizes and food labels. Why are these changes important?  It is possible to prevent or delay type 2 diabetes and related health problems by making lifestyle and nutrition changes.  It can be difficult to recognize signs of type 2 diabetes. The best way to avoid possible damage to your body is to take actions to prevent the disease before you develop symptoms. What can happen if changes are not made?  Your blood glucose levels may keep increasing. Having high blood glucose for a  long time is dangerous. Too much glucose in your blood can damage your blood vessels, heart, kidneys, nerves, and eyes.  You may develop prediabetes or type 2 diabetes. Type 2 diabetes can lead to many chronic health problems and complications, such as: ? Heart disease. ? Stroke. ? Blindness. ? Kidney disease. ? Depression. ? Poor circulation in the feet and legs, which could lead to surgical removal (amputation) in severe cases. Where to find support:  Ask your health care provider to recommend a registered dietitian, diabetes educator, or weight loss program.  Look for local or online weight loss groups.  Join a gym, fitness club, or outdoor activity group, such as a walking club. Where to find more information: To learn more about diabetes and diabetes prevention, visit:  American Diabetes Association (ADA): www.diabetes.[employer] of Diabetes and Digestive and Kidney Diseases: FindSpin.nl  To learn more about healthy eating, visit:  The U.S. Department of Agriculture Scientist, research (physical sciences)), Choose My Plate: http://wiley-williams.com/  Office of Disease Prevention and Health Promotion (ODPHP), Dietary Guidelines: SurferLive.at  Summary  You can reduce your risk for type 2 diabetes by increasing your physical activity, eating healthy foods, and losing weight as directed.  Talk with your health care provider about your risk for type 2 diabetes. Ask about any blood tests or screening tests that you need to have. This information is not intended to replace advice given to you by your health care provider. Make sure you discuss any questions you have with your health care provider. Document Released: 04/19/2015 Document Revised: 06/03/2015 Document Reviewed: 02/16/2015 Elsevier Interactive Patient Education  [employer].

## 2017-07-03 NOTE — Progress Notes (Signed)
Subjective:  Michele Norman is a 37 y.o. female who presents for basic physical exam.  The patient is a Furniture conservator/restorer and presents for a health exam as a requirement by [employer] to keep her insurance premiums low.  Patient denies any current health related concerns today  The patient denies any past medical history such as heart disease, liver disease, kidney disease, lung disease, DM, HTN, asthma, seizures, etc.  The patient currently does not take any medications and has no allergies to medications.  The patient states her immunizations are up to date.  The patient's last PAP smear was in March, 2019 and her LMP was June 21, 2017.  The patient is sexually active and she is actively trying to conceive with her boyfriend.    The patient has a surgical history of wisdom tooth extraction and hysteroscopy.   The patient is not married and has no children. The patient has a family medical history of HTN on her mother side, her mother is alive- age 67 and on her father's side has a history of DM, and thyroid disease, alive, age- 91.  The patient denies the use of recreational drugs, smoking, but drinks socially.    Past Medical History:  Diagnosis Date  . Depression   . Fibroid   . History of abnormal cervical Pap smear   . Ovarian cyst   . Soy protein intolerance   . Uterus, septate     Past Surgical History:  Procedure Laterality Date  . HYSTEROSCOPY N/A 10/07/2014   Procedure: HYSTEROSCOPY WITH metroplasty;  Surgeon: Governor Specking, MD;  Location: [facility];  Service: Gynecology;  Laterality: N/A;  . WISDOM TOOTH EXTRACTION  Feb 2016    Social History   Tobacco Use  . Smoking status: Never Smoker  . Smokeless tobacco: Never Used  Substance Use Topics  . Alcohol use: No  . Drug use: No    Review of Systems  Constitutional: Negative.   HENT: Negative.   Eyes: Negative.   Respiratory: Negative.   Cardiovascular: Negative.   Gastrointestinal: Negative.    Genitourinary: Negative.   Musculoskeletal: Negative.   Skin: Negative.   Neurological: Negative.   Endo/Heme/Allergies: Negative.   Psychiatric/Behavioral: Negative.      Objective:  BP 92/60 (BP Location: Right Arm, Patient Position: Sitting, Cuff Size: Normal)   Pulse 75   Temp 98.3 F (36.8 C) (Oral)   Resp 16   Wt 114 lb 12.8 oz (52.1 kg)   SpO2 99%   BMI 21.34 kg/m   General Appearance:  Alert, cooperative, no distress, appears stated age  Head:  Normocephalic, without obvious abnormality, atraumatic  Eyes:  PERRL, conjunctiva/corneas clear, EOM's intact, fundi benign, both eyes  Ears:  Normal TM's and external ear canals, both ears  Nose: Nares normal, septum midline,mucosa normal, no drainage or sinus tenderness  Throat: Lips, mucosa, and tongue normal; teeth and gums normal  Neck: Supple, symmetrical, trachea midline, no adenopathy;  thyroid: not enlarged, symmetric, no tenderness/mass/nodules; no carotid bruit or JVD  Back:   Symmetric, no curvature, ROM normal, no CVA tenderness  Lungs:   Clear to auscultation bilaterally, respirations unlabored  Breasts:  Deferred  Heart:  Regular rate and rhythm, S1 and S2 normal, no murmur, rub, or gallop  Abdomen:   Soft, non-tender, bowel sounds active all four quadrants,  no masses, no organomegaly  Pelvic: Deferred  Extremities: Extremities normal, atraumatic, no cyanosis or edema  Pulses: 2+ and symmetric  Skin: Skin  color, texture, turgor normal, no rashes or lesions  Lymph nodes: Cervical, supraclavicular, and axillary nodes normal  Neurologic: Normal     Assessment:  basic physical exam    Plan:  Patient education provided.  The patient was informed to follow up with her PCP for labwork or other diagnostic testing.  The patient was provided education on health maintenance, health prevention for her age group, preventing HTN and DM.  The patient states she plans to follow up with her PCP.  The patient verbalizes  understanding and has no questions at time of discharge.  Patient will follow up with PCP.

## 2017-08-06 DIAGNOSIS — N970 Female infertility associated with anovulation: Secondary | ICD-10-CM | POA: Diagnosis not present

## 2017-09-13 DIAGNOSIS — N96 Recurrent pregnancy loss: Secondary | ICD-10-CM | POA: Diagnosis not present

## 2017-09-13 DIAGNOSIS — N925 Other specified irregular menstruation: Secondary | ICD-10-CM | POA: Diagnosis not present

## 2017-09-13 LAB — OB RESULTS CONSOLE ABO/RH: RH TYPE: POSITIVE

## 2017-09-13 LAB — OB RESULTS CONSOLE GC/CHLAMYDIA
Chlamydia: NEGATIVE
Gonorrhea: NEGATIVE

## 2017-09-13 LAB — OB RESULTS CONSOLE RPR: RPR: NONREACTIVE

## 2017-09-13 LAB — OB RESULTS CONSOLE ANTIBODY SCREEN: Antibody Screen: NEGATIVE

## 2017-09-13 LAB — OB RESULTS CONSOLE HEPATITIS B SURFACE ANTIGEN: Hepatitis B Surface Ag: NEGATIVE

## 2017-09-13 LAB — OB RESULTS CONSOLE HIV ANTIBODY (ROUTINE TESTING): HIV: NONREACTIVE

## 2017-09-13 LAB — OB RESULTS CONSOLE RUBELLA ANTIBODY, IGM: Rubella: IMMUNE

## 2017-09-17 DIAGNOSIS — Z3490 Encounter for supervision of normal pregnancy, unspecified, unspecified trimester: Secondary | ICD-10-CM | POA: Insufficient documentation

## 2017-09-24 DIAGNOSIS — N939 Abnormal uterine and vaginal bleeding, unspecified: Secondary | ICD-10-CM | POA: Diagnosis not present

## 2017-09-24 DIAGNOSIS — N72 Inflammatory disease of cervix uteri: Secondary | ICD-10-CM | POA: Diagnosis not present

## 2017-09-24 DIAGNOSIS — R040 Epistaxis: Secondary | ICD-10-CM | POA: Diagnosis not present

## 2017-10-14 DIAGNOSIS — O09529 Supervision of elderly multigravida, unspecified trimester: Secondary | ICD-10-CM | POA: Insufficient documentation

## 2017-10-16 DIAGNOSIS — O09511 Supervision of elderly primigravida, first trimester: Secondary | ICD-10-CM | POA: Diagnosis not present

## 2017-10-16 DIAGNOSIS — Z3491 Encounter for supervision of normal pregnancy, unspecified, first trimester: Secondary | ICD-10-CM | POA: Diagnosis not present

## 2017-10-16 DIAGNOSIS — Z3A13 13 weeks gestation of pregnancy: Secondary | ICD-10-CM | POA: Diagnosis not present

## 2017-10-16 DIAGNOSIS — D563 Thalassemia minor: Secondary | ICD-10-CM | POA: Insufficient documentation

## 2017-10-16 DIAGNOSIS — Z141 Cystic fibrosis carrier: Secondary | ICD-10-CM | POA: Diagnosis not present

## 2017-10-18 ENCOUNTER — Other Ambulatory Visit (HOSPITAL_COMMUNITY): Payer: Self-pay | Admitting: Obstetrics and Gynecology

## 2017-10-29 ENCOUNTER — Ambulatory Visit (HOSPITAL_COMMUNITY): Payer: 59

## 2017-10-29 ENCOUNTER — Encounter (HOSPITAL_COMMUNITY): Payer: 59

## 2017-10-30 ENCOUNTER — Encounter (HOSPITAL_COMMUNITY): Payer: Self-pay | Admitting: *Deleted

## 2017-11-02 ENCOUNTER — Encounter (HOSPITAL_COMMUNITY): Payer: Self-pay

## 2017-11-02 ENCOUNTER — Ambulatory Visit (HOSPITAL_BASED_OUTPATIENT_CLINIC_OR_DEPARTMENT_OTHER)
Admission: RE | Admit: 2017-11-02 | Discharge: 2017-11-02 | Disposition: A | Discharge status: Home / Self Care | Payer: 59 | Source: Ambulatory Visit | Attending: Obstetrics and Gynecology | Admitting: Obstetrics and Gynecology

## 2017-11-02 ENCOUNTER — Ambulatory Visit (HOSPITAL_COMMUNITY)
Admission: RE | Admit: 2017-11-02 | Discharge: 2017-11-02 | Disposition: A | Discharge status: Home / Self Care | Payer: 59 | Source: Ambulatory Visit | Attending: Obstetrics and Gynecology | Admitting: Obstetrics and Gynecology

## 2017-11-02 ENCOUNTER — Ambulatory Visit (HOSPITAL_COMMUNITY): Payer: Self-pay

## 2017-11-02 ENCOUNTER — Other Ambulatory Visit: Payer: Self-pay

## 2017-11-02 VITALS — BP 110/64 | HR 94 | Ht 62.0 in | Wt 118.8 lb

## 2017-11-02 DIAGNOSIS — O26892 Other specified pregnancy related conditions, second trimester: Secondary | ICD-10-CM | POA: Diagnosis not present

## 2017-11-02 DIAGNOSIS — Z315 Encounter for genetic counseling: Secondary | ICD-10-CM | POA: Diagnosis not present

## 2017-11-02 DIAGNOSIS — O09522 Supervision of elderly multigravida, second trimester: Secondary | ICD-10-CM

## 2017-11-02 DIAGNOSIS — Z3A15 15 weeks gestation of pregnancy: Secondary | ICD-10-CM | POA: Diagnosis not present

## 2017-11-02 DIAGNOSIS — D560 Alpha thalassemia: Secondary | ICD-10-CM | POA: Diagnosis not present

## 2017-11-02 DIAGNOSIS — Z141 Cystic fibrosis carrier: Secondary | ICD-10-CM

## 2017-11-02 DIAGNOSIS — O09892 Supervision of other high risk pregnancies, second trimester: Secondary | ICD-10-CM | POA: Diagnosis not present

## 2017-11-02 DIAGNOSIS — Z1371 Encounter for nonprocreative screening for genetic disease carrier status: Secondary | ICD-10-CM | POA: Diagnosis not present

## 2017-11-02 DIAGNOSIS — O352XX0 Maternal care for (suspected) hereditary disease in fetus, not applicable or unspecified: Secondary | ICD-10-CM | POA: Diagnosis not present

## 2017-11-02 HISTORY — DX: Insomnia, unspecified: G47.00

## 2017-11-05 NOTE — Progress Notes (Signed)
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 This consultation is at the request of Donnel Saxon, CNM.  Michele Norman is doing well today without complaints. She denies vaginal bleeding, loss of fluid or uterine contraction. Her EDD is 04.10.2020  By her LMP and consistent with 8 week 6 day ultrasound.   She had a normal panorama result. HbA1C Screen of 5.1%.  She is here for the following issues:  Advanced maternal age: She is 37 yo and will be 61 at the time of delivery. She again had a low risk cell free DNA female. She denies no further testing.  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 Her result were 30 with reference range <25. She had taken low dose ASA as a result. There is also a reference of +ACA in 2016 however there were no records for this result.  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 Her significant other, who is african [place], has not received screening and may not obtain screening as it would not change there care for the pregnancy and he would need to pay out of pocket until his medical benefits are active.  Positive Alpha thalassemia carrier status: she has a positive Alpha thalassemia. Again the significant other has not had testing.  OB History    Gravida  4   Para      Term      Preterm      AB  3   Living  0     SAB  2   TAB  1   Ectopic      Multiple      Live Births             Past Medical History:  Diagnosis Date  . Depression   . Fibroid   . History of abnormal cervical Pap smear   . Insomnia   . Ovarian cyst   . Soy protein intolerance   . Uterus, septate   . Vaginal  Pap smear, abnormal    Past Surgical History:  Procedure Laterality Date  . DILATION AND CURETTAGE OF UTERUS    . HYSTEROSCOPY N/A 10/07/2014   Procedure: HYSTEROSCOPY WITH metroplasty;  Surgeon: Governor Specking, MD;  Location: [facility];  Service: Gynecology;  Laterality: N/A;  . WISDOM TOOTH EXTRACTION  Feb 2016   Social History   Socioeconomic History  . Marital status: Single    Spouse name: Not on file  . Number of children: Not on file  . Years of education: Not on file  . Highest education level: Not on file  Occupational History  . Not on file  Social Needs  . Financial resource strain: Not on file  . Food insecurity:    Worry: Not on file    Inability: Not on file  . Transportation needs:    Medical: Not on file    Non-medical: Not on file  Tobacco Use  . Smoking status: Never Smoker  . Smokeless tobacco: Never Used  Substance and Sexual Activity  . Alcohol use: No  . Drug use: No  . Sexual activity: Yes    Birth control/protection:  None  Lifestyle  . Physical activity:    Days per week: Not on file    Minutes per session: Not on file  . Stress: Not on file  Relationships  . Social connections:    Talks on phone: Not on file    Gets together: Not on file    Attends religious service: Not on file    Active member of club or organization: Not on file    Attends meetings of clubs or organizations: Not on file    Relationship status: Not on file  . Intimate partner violence:    Fear of current or ex partner: Not on file    Emotionally abused: Not on file    Physically abused: Not on file    Forced sexual activity: Not on file  Other Topics Concern  . Not on file  Social History Narrative  . Not on file    Current Outpatient Medications:  .  Loratadine (CLARITIN PO), Take by mouth., Disp: , Rfl:  .  Prenatal Multivit-Min-Fe-FA (PRENATAL VITAMINS PO), Take by mouth., Disp: , Rfl:   Family History  Problem Relation Age of Onset  .  Thyroid disease Father   . Heart murmur Brother   . Stroke Maternal Grandmother   . Sickle cell anemia Brother      Impression:  1) AMA 2) +ACA antibodies 3) CF Carrier+  4) Alpha thalassemia carrier +  Counseling: 1. AMA. I discussed the increased risk of aneuploidy however, in light of a low risk panorama, I conveyed that her overall risk are low for the tested aneuploidies. Secondly, she had genetic counseling see note and both non-invasive and invasive testing were reviewed, Michele Norman and her significant other declined further testing.  Secondly, we discussed the slight increased risk for gestational diabetes, hypertension, fetal growth restriction and still birth. Given these risk I recommend serial growth starting at [redacted] weeks gestation. We reserve addition fetal surveillance if fetal growth restriction is detected.  2. +CF and alpha thalassemia carrier status- We discussed the 25 % risk for the pregnancy to be affected given the autosomal recessive inheritance pattern. More importantly, we discussed that this risk is determined by the paternal status. The father of the baby plans to obtain testing in the future. Also this was discussed in detail with there genetic counseling appointment.  3. +ACA antibodies: We discussed the association with antiphospholipid antibody syndrome. However, I conveyed that I do not think she has APLAS. Given that her labs are just above normal and she does not meet clinical criteria of a vascular condition or pregnancy event. At this time I recommend no further testing and it is not necessary to initiate low dose ASA for this reason.  Recommendation: 1) AMA- serial growth every 4 weeks beginning at 28-32 weeks. Detailed comprehensive exam at 18-20 weeks 2) +CF/Alpha thal testing- FOB testing at [facility]  I met with Michele Norman for 50 minutes with >50% in face to face consultation. All questions were answered.  Vikki Ports, MD.

## 2017-11-06 ENCOUNTER — Other Ambulatory Visit: Payer: Self-pay

## 2017-11-13 DIAGNOSIS — Z369 Encounter for antenatal screening, unspecified: Secondary | ICD-10-CM | POA: Diagnosis not present

## 2017-11-14 ENCOUNTER — Telehealth (HOSPITAL_COMMUNITY): Payer: Self-pay | Admitting: *Deleted

## 2017-11-14 ENCOUNTER — Other Ambulatory Visit (HOSPITAL_COMMUNITY): Payer: Self-pay

## 2017-11-19 ENCOUNTER — Other Ambulatory Visit (HOSPITAL_COMMUNITY): Payer: Self-pay

## 2017-11-21 ENCOUNTER — Other Ambulatory Visit (HOSPITAL_COMMUNITY): Payer: Self-pay | Admitting: *Deleted

## 2017-11-21 ENCOUNTER — Telehealth (HOSPITAL_COMMUNITY): Payer: Self-pay | Admitting: *Deleted

## 2017-11-21 DIAGNOSIS — O09522 Supervision of elderly multigravida, second trimester: Secondary | ICD-10-CM

## 2017-11-21 NOTE — Telephone Encounter (Signed)
Called patient regarding MFM appt on 11/22/17.  Pt voiced that after her genetic counseling appt she changed her mind and has now decided to proceed with amniocentesis.  Spoke with Dr. Donalee Citrin, he will order a limited US and discuss possible amniocentesis with her on 11/22/17.

## 2017-11-22 ENCOUNTER — Ambulatory Visit (HOSPITAL_COMMUNITY)
Admission: RE | Admit: 2017-11-22 | Discharge: 2017-11-22 | Disposition: A | Discharge status: Home / Self Care | Payer: 59 | Source: Ambulatory Visit | Attending: Obstetrics and Gynecology | Admitting: Obstetrics and Gynecology

## 2017-11-22 ENCOUNTER — Other Ambulatory Visit (HOSPITAL_COMMUNITY): Payer: Self-pay

## 2017-11-22 ENCOUNTER — Other Ambulatory Visit (HOSPITAL_COMMUNITY): Payer: Self-pay | Admitting: Obstetrics and Gynecology

## 2017-11-22 ENCOUNTER — Encounter (HOSPITAL_COMMUNITY): Payer: Self-pay

## 2017-11-22 DIAGNOSIS — O09522 Supervision of elderly multigravida, second trimester: Secondary | ICD-10-CM

## 2017-11-22 DIAGNOSIS — O9989 Other specified diseases and conditions complicating pregnancy, childbirth and the puerperium: Secondary | ICD-10-CM

## 2017-11-22 DIAGNOSIS — Z3A18 18 weeks gestation of pregnancy: Secondary | ICD-10-CM | POA: Diagnosis not present

## 2017-11-22 DIAGNOSIS — O322XX0 Maternal care for transverse and oblique lie, not applicable or unspecified: Secondary | ICD-10-CM | POA: Diagnosis not present

## 2017-11-22 DIAGNOSIS — O09892 Supervision of other high risk pregnancies, second trimester: Secondary | ICD-10-CM | POA: Diagnosis not present

## 2017-11-22 DIAGNOSIS — O09512 Supervision of elderly primigravida, second trimester: Secondary | ICD-10-CM | POA: Diagnosis not present

## 2017-11-24 ENCOUNTER — Other Ambulatory Visit (HOSPITAL_COMMUNITY): Payer: Self-pay

## 2017-11-26 ENCOUNTER — Telehealth (HOSPITAL_COMMUNITY): Payer: Self-pay | Admitting: *Deleted

## 2017-11-26 NOTE — Telephone Encounter (Signed)
Name and DOB verified.  Normal female by [facility] result given.  Pt voiced understanding.

## 2017-11-27 DIAGNOSIS — Z3A19 19 weeks gestation of pregnancy: Secondary | ICD-10-CM | POA: Diagnosis not present

## 2017-11-27 DIAGNOSIS — Z363 Encounter for antenatal screening for malformations: Secondary | ICD-10-CM | POA: Diagnosis not present

## 2017-12-03 ENCOUNTER — Other Ambulatory Visit (HOSPITAL_COMMUNITY): Payer: Self-pay

## 2017-12-05 ENCOUNTER — Telehealth (HOSPITAL_COMMUNITY): Payer: Self-pay | Admitting: *Deleted

## 2017-12-05 NOTE — Telephone Encounter (Signed)
Name and DOB verified, normal chromosome microarray results from amnio given.  Alpha thal and Gene sequencing pending.  Pt voiced understanding.

## 2017-12-25 DIAGNOSIS — D6861 Antiphospholipid syndrome: Secondary | ICD-10-CM | POA: Insufficient documentation

## 2018-01-09 NOTE — L&D Delivery Note (Signed)
Delivery Note   Patient Name: Michele Norman DOB: 08/24/1980 MRN: [id number]  Date of admission: 04/17/2018 Delivering MD: Noralyn Pick  Date of delivery: 04/17/18 Type of delivery: SVD  Newborn Data: Live born female  Birth Weight:   APGAR: 3, 105  Newborn Delivery   Birth date/time:  04/17/2018 21:34:00 Delivery type:  Vaginal, Spontaneous     [name], 38 y.o., @ [redacted]w[redacted]d,  G4P0030, who was admitted for IOL for polyhydramnios. I was called to the room when she progressed +2 station in the second stage of labor. Maternal temp was noted at 101.3, with off an on maternal tachycardia, fetal baseline remained in the 150s. Tylenol had been given @ 1830 no decreased noted, suspected IUI, Unasyn 3gm ordered. She pushed for 1hours/61min. Unasyn had finished infusion. She delivered a viable infant, cephalic and restituted to the LOA position over an intact perineum.  A tight nuchal cord   was identified, NICU was called to delviery and maternal dystocia occurred, anterior shoulder delivered over 30 seconds, then posterior took another 30 seconds infant somersaulted through the tight cord. The baby was placed on maternal abdomen while initial step of NRP were perfmored (Dry, Stimulated, and warmed). Hat placed on baby for thermoregulation. Delayed cord clamping was not performed..  Cord double clamped and cut.  Cord cut by myself but left long for dad. Infant was taken to radiant warmer and assessed by the NICU team. Apgar scores were 3 and 9. Prophylactic Pitocin was started in the third stage of labor for active management. The placenta delivered spontaneously, shultz, with a 3 vessel cord and was sent to pathology.  Inspection revealed 1st degree and labial. An examination of the vaginal vault and cervix was free from lacerations. The uterus was firm, bleeding stable.  The repair was done under epidural.   Placenta and umbilical artery blood gas were sent.  There were no complications during the  procedure.  Mom and baby skin to skin following delivery. Left in stable condition. Will monitor pt temp and if over 100.4 will give 24 hour of Unasyn.   Maternal Info: Anesthesia:Epidural Episiotomy: No Lacerations:  1st and right labial Suture Repair: 3.0 chromic SH Est. Blood Loss (mL):  334  Newborn Info:  Baby Sex: female Circumcision: ??? Babies Name: Naz APGAR (1 MIN): 3   APGAR (5 MINS): 9   APGAR (10 MINS):    Ph Cord Blood (Venous) 7.240 - 7.380 7.370   pCO2 Cord Blood (Venous) 42.0 - 56.0 36.1Low    Bicarbonate 13.0 - 22.0 mmol/L 20.4       Mom to postpartum.  Baby to Couplet care / Skin to Skin.  Dr Mancel Bale aware of pt status and delivery details.   [place], [place], NP-C 04/17/18 10:06 PM

## 2018-01-10 ENCOUNTER — Other Ambulatory Visit (HOSPITAL_COMMUNITY): Payer: Self-pay

## 2018-01-22 ENCOUNTER — Ambulatory Visit (HOSPITAL_COMMUNITY): Payer: Self-pay

## 2018-01-23 DIAGNOSIS — Z369 Encounter for antenatal screening, unspecified: Secondary | ICD-10-CM | POA: Diagnosis not present

## 2018-01-23 DIAGNOSIS — Z362 Encounter for other antenatal screening follow-up: Secondary | ICD-10-CM | POA: Diagnosis not present

## 2018-01-23 DIAGNOSIS — Z3A27 27 weeks gestation of pregnancy: Secondary | ICD-10-CM | POA: Diagnosis not present

## 2018-01-23 DIAGNOSIS — D560 Alpha thalassemia: Secondary | ICD-10-CM | POA: Diagnosis not present

## 2018-01-23 DIAGNOSIS — D6861 Antiphospholipid syndrome: Secondary | ICD-10-CM | POA: Diagnosis not present

## 2018-02-07 DIAGNOSIS — O1213 Gestational proteinuria, third trimester: Secondary | ICD-10-CM | POA: Diagnosis not present

## 2018-02-07 DIAGNOSIS — D6861 Antiphospholipid syndrome: Secondary | ICD-10-CM | POA: Diagnosis not present

## 2018-02-07 DIAGNOSIS — Z3A29 29 weeks gestation of pregnancy: Secondary | ICD-10-CM | POA: Diagnosis not present

## 2018-02-07 DIAGNOSIS — O403XX0 Polyhydramnios, third trimester, not applicable or unspecified: Secondary | ICD-10-CM | POA: Diagnosis not present

## 2018-02-14 DIAGNOSIS — R102 Pelvic and perineal pain: Secondary | ICD-10-CM | POA: Diagnosis not present

## 2018-02-14 DIAGNOSIS — Z3A30 30 weeks gestation of pregnancy: Secondary | ICD-10-CM | POA: Diagnosis not present

## 2018-02-21 DIAGNOSIS — O403XX0 Polyhydramnios, third trimester, not applicable or unspecified: Secondary | ICD-10-CM | POA: Diagnosis not present

## 2018-02-21 DIAGNOSIS — Z3A31 31 weeks gestation of pregnancy: Secondary | ICD-10-CM | POA: Diagnosis not present

## 2018-03-08 DIAGNOSIS — O403XX0 Polyhydramnios, third trimester, not applicable or unspecified: Secondary | ICD-10-CM | POA: Diagnosis not present

## 2018-03-08 DIAGNOSIS — Z3493 Encounter for supervision of normal pregnancy, unspecified, third trimester: Secondary | ICD-10-CM | POA: Diagnosis not present

## 2018-03-08 DIAGNOSIS — Z3A33 33 weeks gestation of pregnancy: Secondary | ICD-10-CM | POA: Diagnosis not present

## 2018-03-08 DIAGNOSIS — O0993 Supervision of high risk pregnancy, unspecified, third trimester: Secondary | ICD-10-CM | POA: Diagnosis not present

## 2018-03-12 DIAGNOSIS — Z3A34 34 weeks gestation of pregnancy: Secondary | ICD-10-CM | POA: Diagnosis not present

## 2018-03-12 DIAGNOSIS — O403XX0 Polyhydramnios, third trimester, not applicable or unspecified: Secondary | ICD-10-CM | POA: Diagnosis not present

## 2018-03-17 ENCOUNTER — Encounter (HOSPITAL_COMMUNITY): Payer: Self-pay | Admitting: *Deleted

## 2018-03-17 ENCOUNTER — Other Ambulatory Visit: Payer: Self-pay

## 2018-03-17 ENCOUNTER — Inpatient Hospital Stay (HOSPITAL_COMMUNITY)
Admission: AD | Admit: 2018-03-17 | Discharge: 2018-03-17 | Disposition: A | Discharge status: Home / Self Care | Payer: 59 | Attending: Obstetrics and Gynecology | Admitting: Obstetrics and Gynecology

## 2018-03-17 DIAGNOSIS — R2243 Localized swelling, mass and lump, lower limb, bilateral: Secondary | ICD-10-CM | POA: Insufficient documentation

## 2018-03-17 DIAGNOSIS — O99353 Diseases of the nervous system complicating pregnancy, third trimester: Secondary | ICD-10-CM | POA: Diagnosis not present

## 2018-03-17 DIAGNOSIS — O1203 Gestational edema, third trimester: Secondary | ICD-10-CM | POA: Diagnosis not present

## 2018-03-17 DIAGNOSIS — Z3A34 34 weeks gestation of pregnancy: Secondary | ICD-10-CM

## 2018-03-17 DIAGNOSIS — O99343 Other mental disorders complicating pregnancy, third trimester: Secondary | ICD-10-CM | POA: Diagnosis not present

## 2018-03-17 DIAGNOSIS — M7989 Other specified soft tissue disorders: Secondary | ICD-10-CM | POA: Insufficient documentation

## 2018-03-17 DIAGNOSIS — F329 Major depressive disorder, single episode, unspecified: Secondary | ICD-10-CM | POA: Insufficient documentation

## 2018-03-17 DIAGNOSIS — O3483 Maternal care for other abnormalities of pelvic organs, third trimester: Secondary | ICD-10-CM | POA: Diagnosis not present

## 2018-03-17 DIAGNOSIS — Z79899 Other long term (current) drug therapy: Secondary | ICD-10-CM | POA: Diagnosis not present

## 2018-03-17 DIAGNOSIS — N83209 Unspecified ovarian cyst, unspecified side: Secondary | ICD-10-CM | POA: Insufficient documentation

## 2018-03-17 LAB — URINALYSIS, ROUTINE W REFLEX MICROSCOPIC
Bacteria, UA: NONE SEEN
Bilirubin Urine: NEGATIVE
Glucose, UA: 50 mg/dL — AB
Hgb urine dipstick: NEGATIVE
Ketones, ur: NEGATIVE mg/dL
NITRITE: NEGATIVE
Protein, ur: NEGATIVE mg/dL
Specific Gravity, Urine: 1.008 (ref 1.005–1.030)
pH: 6 (ref 5.0–8.0)

## 2018-03-17 NOTE — MAU Note (Signed)
Pt here with c/o swelling in lower extremities for the last week. Denies HA or blurred vision. Denies any bleeding or leaking. Reports good fetal movement.

## 2018-03-17 NOTE — Discharge Instructions (Signed)
Edema  Edema is when you have too much fluid in your body or under your skin. Edema may make your legs, feet, and ankles swell up. Swelling is also common in looser tissues, like around your eyes. This is a common condition. It gets more common as you get older. There are many possible causes of edema. Eating too much salt (sodium) and being on your feet or sitting for a long time can cause edema in your legs, feet, and ankles. Hot weather may make edema worse. Edema is usually painless. Your skin may look swollen or shiny. Follow these instructions at home:  Keep the swollen body part raised (elevated) above the level of your heart when you are sitting or lying down.  Do not sit still or stand for a long time.  Do not wear tight clothes. Do not wear garters on your upper legs.  Exercise your legs. This can help the swelling go down.  Wear elastic bandages or support stockings as told by your doctor.  Eat a low-salt (low-sodium) diet to reduce fluid as told by your doctor.  Depending on the cause of your swelling, you may need to limit how much fluid you drink (fluid restriction).  Take over-the-counter and prescription medicines only as told by your doctor. Contact a doctor if:  Treatment is not working.  You have heart, liver, or kidney disease and have symptoms of edema.  You have sudden and unexplained weight gain. Get help right away if:  You have shortness of breath or chest pain.  You cannot breathe when you lie down.  You have pain, redness, or warmth in the swollen areas.  You have heart, liver, or kidney disease and get edema all of a sudden.  You have a fever and your symptoms get worse all of a sudden. Summary  Edema is when you have too much fluid in your body or under your skin.  Edema may make your legs, feet, and ankles swell up. Swelling is also common in looser tissues, like around your eyes.  Raise (elevate) the swollen body part above the level of your  heart when you are sitting or lying down.  Follow your doctor's instructions about diet and how much fluid you can drink (fluid restriction). This information is not intended to replace advice given to you by your health care provider. Make sure you discuss any questions you have with your health care provider. Document Released: 06/14/2007 Document Revised: 01/14/2016 Document Reviewed: 01/14/2016 Elsevier Interactive Patient Education  2019 [employer] Medications in Pregnancy   Acne: Benzoyl Peroxide Salicylic Acid  Backache/Headache: Tylenol: 2 regular strength every 4 hours OR              2 Extra strength every 6 hours  Colds/Coughs/Allergies: Benadryl (alcohol free) 25 mg every 6 hours as needed Breath right strips Claritin Cepacol throat lozenges Chloraseptic throat spray Cold-Eeze- up to three times per day Cough drops, alcohol free Flonase (by prescription only) Guaifenesin Mucinex Robitussin DM (plain only, alcohol free) Saline nasal spray/drops Sudafed (pseudoephedrine) & Actifed ** use only after [redacted] weeks gestation and if you do not have high blood pressure Tylenol Vicks Vaporub Zinc lozenges Zyrtec   Constipation: Colace Ducolax suppositories Fleet enema Glycerin suppositories Metamucil Milk of magnesia Miralax Senokot Smooth move tea  Diarrhea: Kaopectate Imodium A-D  *NO pepto Bismol  Hemorrhoids: Anusol Anusol HC Preparation H Tucks  Indigestion: Tums Maalox Mylanta Zantac  Pepcid  Insomnia: Benadryl (alcohol free) 25mg  every 6  hours as needed Tylenol PM Unisom, no Gelcaps  Leg Cramps: Tums MagGel  Nausea/Vomiting:  Bonine Dramamine Emetrol Ginger extract Sea bands Meclizine  Nausea medication to take during pregnancy:  Unisom (doxylamine succinate 25 mg tablets) Take one tablet daily at bedtime. If symptoms are not adequately controlled, the dose can be increased to a maximum recommended dose of two tablets  daily (1/2 tablet in the morning, 1/2 tablet mid-afternoon and one at bedtime). Vitamin B6 100mg  tablets. Take one tablet twice a day (up to 200 mg per day).  Skin Rashes: Aveeno products Benadryl cream or 25mg  every 6 hours as needed Calamine Lotion 1% cortisone cream  Yeast infection: Gyne-lotrimin 7 Monistat 7   **If taking multiple medications, please check labels to avoid duplicating the same active ingredients **take medication as directed on the label ** Do not exceed 4000 mg of tylenol in 24 hours **Do not take medications that contain aspirin or ibuprofen

## 2018-03-17 NOTE — MAU Provider Note (Signed)
History     CSN: [id number]  Arrival date and time: 03/17/18 1910   First Provider Initiated Contact with Patient 03/17/18 2006      Chief Complaint  Patient presents with  . Foot Swelling   HPI Michele Norman is a 38 y.o. G4P0030 at [redacted]w[redacted]d who presents with leg swelling. She states it has been on and off over the last week but worse tonight. She denies any pain in her legs. She denies any abdominal pain, vaginal bleeding, or leaking of fluid. She reports normal fetal movement.  OB History    Gravida  4   Para      Term      Preterm      AB  3   Living  0     SAB  2   TAB  1   Ectopic      Multiple      Live Births              Past Medical History:  Diagnosis Date  . Depression   . Fibroid   . History of abnormal cervical Pap smear   . Insomnia   . Ovarian cyst   . Soy protein intolerance   . Uterus, septate   . Vaginal Pap smear, abnormal     Past Surgical History:  Procedure Laterality Date  . DILATION AND CURETTAGE OF UTERUS    . HYSTEROSCOPY N/A 10/07/2014   Procedure: HYSTEROSCOPY WITH metroplasty;  Surgeon: Governor Specking, MD;  Location: [facility];  Service: Gynecology;  Laterality: N/A;  . WISDOM TOOTH EXTRACTION  Feb 2016    Family History  Problem Relation Age of Onset  . Thyroid disease Father   . Heart murmur Brother   . Stroke Maternal Grandmother   . Sickle cell anemia Brother     Social History   Tobacco Use  . Smoking status: Never Smoker  . Smokeless tobacco: Never Used  Substance Use Topics  . Alcohol use: No  . Drug use: No    Allergies: No Known Allergies  Medications Prior to Admission  Medication Sig Dispense Refill Last Dose  . Loratadine (CLARITIN PO) Take by mouth.   Taking  . Prenatal Multivit-Min-Fe-FA (PRENATAL VITAMINS PO) Take by mouth.   Taking    Review of Systems  Constitutional: Negative.  Negative for fatigue and fever.  HENT: Negative.   Respiratory: Negative.  Negative  for shortness of breath.   Cardiovascular: Negative.  Negative for chest pain.  Gastrointestinal: Negative.  Negative for abdominal pain, constipation, diarrhea, nausea and vomiting.  Genitourinary: Negative.  Negative for dysuria.  Musculoskeletal:       Swelling  Neurological: Negative.  Negative for dizziness and headaches.   Physical Exam   Blood pressure 114/65, pulse 88, temperature 98.7 F (37.1 C), temperature source Oral, resp. rate 18, height 5\' 2"  (1.575 m), weight 69.9 kg, last menstrual period 07/16/2017, SpO2 99 %.  Physical Exam  Nursing note and vitals reviewed. Constitutional: She is oriented to person, place, and time. She appears well-developed and well-nourished. No distress.  HENT:  Head: Normocephalic.  Eyes: Pupils are equal, round, and reactive to light.  Cardiovascular: Normal rate, regular rhythm and normal heart sounds.  Respiratory: Effort normal and breath sounds normal. No respiratory distress.  GI: Soft. Bowel sounds are normal. She exhibits no distension. There is no abdominal tenderness.  Musculoskeletal:     Comments: Bilateral feet and ankle swelling  Neurological: She is alert and  oriented to person, place, and time.  Skin: Skin is warm and dry.  Psychiatric: She has a normal mood and affect. Her behavior is normal. Judgment and thought content normal.    MAU Course  Procedures Results for orders placed or performed during the hospital encounter of 03/17/18 (from the past 24 hour(s))  Urinalysis, Routine w reflex microscopic     Status: Abnormal   Collection Time: 03/17/18  7:42 PM  Result Value Ref Range   Color, Urine STRAW (A) YELLOW   APPearance CLEAR CLEAR   Specific Gravity, Urine 1.008 1.005 - 1.030   pH 6.0 5.0 - 8.0   Glucose, UA 50 (A) NEGATIVE mg/dL   Hgb urine dipstick NEGATIVE NEGATIVE   Bilirubin Urine NEGATIVE NEGATIVE   Ketones, ur NEGATIVE NEGATIVE mg/dL   Protein, ur NEGATIVE NEGATIVE mg/dL   Nitrite NEGATIVE NEGATIVE    Leukocytes,Ua SMALL (A) NEGATIVE   RBC / HPF 0-5 0 - 5 RBC/hpf   WBC, UA 0-5 0 - 5 WBC/hpf   Bacteria, UA NONE SEEN NONE SEEN   Squamous Epithelial / LPF 0-5 0 - 5   Mucus PRESENT    MDM Prenatal records from private office reviewed. 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 Labs ordered and reviewed.  UA NST reactive  Normotensive with no signs or symptoms of preeclampsia at this time. Per review of prenatal records, patient has had complaints of swelling throughout the pregnancy. Reassurance of normalcy of swelling in pregnancy reviewed with patient.   Assessment and Plan   1. Localized swelling of both lower extremities   2. [redacted] weeks gestation of pregnancy    -Discharge home in stable condition -Hypertension precautions discussed -Patient advised to follow-up with [facility] as scheduled on Wednesday for prenatal care -Patient may return to MAU as needed or if her condition were to change or worsen  Wende Mott CNM 03/17/2018, 8:06 PM

## 2018-03-20 DIAGNOSIS — O403XX3 Polyhydramnios, third trimester, fetus 3: Secondary | ICD-10-CM | POA: Diagnosis not present

## 2018-03-20 DIAGNOSIS — F329 Major depressive disorder, single episode, unspecified: Secondary | ICD-10-CM | POA: Diagnosis not present

## 2018-03-20 DIAGNOSIS — O403XX0 Polyhydramnios, third trimester, not applicable or unspecified: Secondary | ICD-10-CM | POA: Diagnosis not present

## 2018-03-20 DIAGNOSIS — D6861 Antiphospholipid syndrome: Secondary | ICD-10-CM | POA: Diagnosis not present

## 2018-03-20 DIAGNOSIS — Z3A35 35 weeks gestation of pregnancy: Secondary | ICD-10-CM | POA: Diagnosis not present

## 2018-03-20 DIAGNOSIS — D563 Thalassemia minor: Secondary | ICD-10-CM | POA: Diagnosis not present

## 2018-03-20 DIAGNOSIS — Z0184 Encounter for antibody response examination: Secondary | ICD-10-CM | POA: Diagnosis not present

## 2018-03-20 DIAGNOSIS — O09899 Supervision of other high risk pregnancies, unspecified trimester: Secondary | ICD-10-CM | POA: Diagnosis not present

## 2018-03-20 DIAGNOSIS — Z3403 Encounter for supervision of normal first pregnancy, third trimester: Secondary | ICD-10-CM | POA: Diagnosis not present

## 2018-03-20 DIAGNOSIS — Z13228 Encounter for screening for other metabolic disorders: Secondary | ICD-10-CM | POA: Diagnosis not present

## 2018-03-26 DIAGNOSIS — O403XX0 Polyhydramnios, third trimester, not applicable or unspecified: Secondary | ICD-10-CM | POA: Diagnosis not present

## 2018-03-26 DIAGNOSIS — Z3493 Encounter for supervision of normal pregnancy, unspecified, third trimester: Secondary | ICD-10-CM | POA: Diagnosis not present

## 2018-03-26 DIAGNOSIS — Z369 Encounter for antenatal screening, unspecified: Secondary | ICD-10-CM | POA: Diagnosis not present

## 2018-03-26 DIAGNOSIS — Z3A36 36 weeks gestation of pregnancy: Secondary | ICD-10-CM | POA: Diagnosis not present

## 2018-03-26 LAB — OB RESULTS CONSOLE GBS: GBS: NEGATIVE

## 2018-04-04 DIAGNOSIS — Z3493 Encounter for supervision of normal pregnancy, unspecified, third trimester: Secondary | ICD-10-CM | POA: Diagnosis not present

## 2018-04-04 DIAGNOSIS — O403XX0 Polyhydramnios, third trimester, not applicable or unspecified: Secondary | ICD-10-CM | POA: Diagnosis not present

## 2018-04-04 DIAGNOSIS — Z3A37 37 weeks gestation of pregnancy: Secondary | ICD-10-CM | POA: Diagnosis not present

## 2018-04-08 ENCOUNTER — Telehealth (HOSPITAL_COMMUNITY): Payer: Self-pay | Admitting: *Deleted

## 2018-04-08 ENCOUNTER — Encounter (HOSPITAL_COMMUNITY): Payer: Self-pay | Admitting: *Deleted

## 2018-04-08 NOTE — Telephone Encounter (Signed)
Preadmission screen  

## 2018-04-09 ENCOUNTER — Other Ambulatory Visit: Payer: Self-pay | Admitting: Obstetrics & Gynecology

## 2018-04-10 DIAGNOSIS — Z3A38 38 weeks gestation of pregnancy: Secondary | ICD-10-CM | POA: Diagnosis not present

## 2018-04-10 DIAGNOSIS — O403XX0 Polyhydramnios, third trimester, not applicable or unspecified: Secondary | ICD-10-CM | POA: Diagnosis not present

## 2018-04-10 DIAGNOSIS — D6861 Antiphospholipid syndrome: Secondary | ICD-10-CM | POA: Diagnosis not present

## 2018-04-16 ENCOUNTER — Other Ambulatory Visit (HOSPITAL_COMMUNITY): Payer: Self-pay | Admitting: *Deleted

## 2018-04-17 ENCOUNTER — Inpatient Hospital Stay (HOSPITAL_COMMUNITY): Payer: 59 | Admitting: Anesthesiology

## 2018-04-17 ENCOUNTER — Inpatient Hospital Stay (HOSPITAL_COMMUNITY)
Admission: AD | Admit: 2018-04-17 | Discharge: 2018-04-19 | DRG: 806 | Disposition: A | Discharge status: Home / Self Care | Payer: 59 | Attending: Obstetrics & Gynecology | Admitting: Obstetrics & Gynecology

## 2018-04-17 ENCOUNTER — Encounter (HOSPITAL_COMMUNITY): Payer: Self-pay

## 2018-04-17 ENCOUNTER — Inpatient Hospital Stay (HOSPITAL_COMMUNITY): Payer: 59

## 2018-04-17 ENCOUNTER — Other Ambulatory Visit: Payer: Self-pay

## 2018-04-17 DIAGNOSIS — O3403 Maternal care for unspecified congenital malformation of uterus, third trimester: Secondary | ICD-10-CM | POA: Diagnosis present

## 2018-04-17 DIAGNOSIS — B009 Herpesviral infection, unspecified: Secondary | ICD-10-CM | POA: Insufficient documentation

## 2018-04-17 DIAGNOSIS — F329 Major depressive disorder, single episode, unspecified: Secondary | ICD-10-CM | POA: Insufficient documentation

## 2018-04-17 DIAGNOSIS — F32A Depression, unspecified: Secondary | ICD-10-CM | POA: Insufficient documentation

## 2018-04-17 DIAGNOSIS — Q51810 Arcuate uterus: Secondary | ICD-10-CM

## 2018-04-17 DIAGNOSIS — D649 Anemia, unspecified: Secondary | ICD-10-CM | POA: Diagnosis present

## 2018-04-17 DIAGNOSIS — O403XX0 Polyhydramnios, third trimester, not applicable or unspecified: Principal | ICD-10-CM | POA: Diagnosis present

## 2018-04-17 DIAGNOSIS — D563 Thalassemia minor: Secondary | ICD-10-CM | POA: Diagnosis present

## 2018-04-17 DIAGNOSIS — D259 Leiomyoma of uterus, unspecified: Secondary | ICD-10-CM | POA: Insufficient documentation

## 2018-04-17 DIAGNOSIS — Z3A39 39 weeks gestation of pregnancy: Secondary | ICD-10-CM | POA: Diagnosis not present

## 2018-04-17 DIAGNOSIS — O9902 Anemia complicating childbirth: Secondary | ICD-10-CM | POA: Diagnosis present

## 2018-04-17 DIAGNOSIS — A6000 Herpesviral infection of urogenital system, unspecified: Secondary | ICD-10-CM | POA: Diagnosis present

## 2018-04-17 DIAGNOSIS — O9832 Other infections with a predominantly sexual mode of transmission complicating childbirth: Secondary | ICD-10-CM | POA: Diagnosis present

## 2018-04-17 LAB — CBC
HCT: 33.9 % — ABNORMAL LOW (ref 36.0–46.0)
Hemoglobin: 10.5 g/dL — ABNORMAL LOW (ref 12.0–15.0)
MCH: 24.9 pg — ABNORMAL LOW (ref 26.0–34.0)
MCHC: 31 g/dL (ref 30.0–36.0)
MCV: 80.5 fL (ref 80.0–100.0)
Platelets: 172 10*3/uL (ref 150–400)
RBC: 4.21 MIL/uL (ref 3.87–5.11)
RDW: 17.5 % — ABNORMAL HIGH (ref 11.5–15.5)
WBC: 7.5 10*3/uL (ref 4.0–10.5)
nRBC: 0.3 % — ABNORMAL HIGH (ref 0.0–0.2)

## 2018-04-17 LAB — TYPE AND SCREEN
ABO/RH(D): B POS
Antibody Screen: NEGATIVE

## 2018-04-17 LAB — RPR: RPR Ser Ql: NONREACTIVE

## 2018-04-17 LAB — ABO/RH: ABO/RH(D): B POS

## 2018-04-17 MED ORDER — BENZOCAINE-MENTHOL 20-0.5 % EX AERO
1.0000 "application " | INHALATION_SPRAY | CUTANEOUS | Status: DC | PRN
  Administered 2018-04-18: 1 via TOPICAL
  Filled 2018-04-17: qty 56

## 2018-04-17 MED ORDER — TERBUTALINE SULFATE 1 MG/ML IJ SOLN: 0.2500 mg | Freq: Once | INTRAMUSCULAR | Status: DC | PRN

## 2018-04-17 MED ORDER — OXYTOCIN BOLUS FROM INFUSION
500.0000 mL | Freq: Once | INTRAVENOUS | Status: AC
  Administered 2018-04-17: 500 mL via INTRAVENOUS

## 2018-04-17 MED ORDER — WITCH HAZEL-GLYCERIN EX PADS
1.0000 "application " | MEDICATED_PAD | CUTANEOUS | Status: DC | PRN
  Administered 2018-04-18: 1 via TOPICAL

## 2018-04-17 MED ORDER — LIDOCAINE HCL (PF) 1 % IJ SOLN
INTRAMUSCULAR | Status: DC | PRN
  Administered 2018-04-17: 6 mL via EPIDURAL

## 2018-04-17 MED ORDER — LACTATED RINGERS IV SOLN: 500.0000 mL | Freq: Once | INTRAVENOUS | Status: DC

## 2018-04-17 MED ORDER — SIMETHICONE 80 MG PO CHEW: 80.0000 mg | CHEWABLE_TABLET | ORAL | Status: DC | PRN

## 2018-04-17 MED ORDER — FENTANYL-BUPIVACAINE-NACL 0.5-0.125-0.9 MG/250ML-% EP SOLN
12.0000 mL/h | EPIDURAL | Status: DC | PRN
  Filled 2018-04-17: qty 250

## 2018-04-17 MED ORDER — LACTATED RINGERS IV SOLN
INTRAVENOUS | Status: DC
  Administered 2018-04-17 (×2): via INTRAVENOUS

## 2018-04-17 MED ORDER — ACETAMINOPHEN 500 MG PO TABS
1000.0000 mg | ORAL_TABLET | Freq: Once | ORAL | Status: AC
  Administered 2018-04-17: 1000 mg via ORAL
  Filled 2018-04-17: qty 2

## 2018-04-17 MED ORDER — COCONUT OIL OIL
1.0000 "application " | TOPICAL_OIL | Status: DC | PRN
  Administered 2018-04-19: 1 via TOPICAL

## 2018-04-17 MED ORDER — ACETAMINOPHEN 325 MG PO TABS: 650.0000 mg | ORAL_TABLET | ORAL | Status: DC | PRN

## 2018-04-17 MED ORDER — ONDANSETRON HCL 4 MG PO TABS: 4.0000 mg | ORAL_TABLET | ORAL | Status: DC | PRN

## 2018-04-17 MED ORDER — LACTATED RINGERS IV SOLN
500.0000 mL | INTRAVENOUS | Status: DC | PRN
  Administered 2018-04-17 (×2): 500 mL via INTRAVENOUS

## 2018-04-17 MED ORDER — DIBUCAINE (PERIANAL) 1 % EX OINT
1.0000 "application " | TOPICAL_OINTMENT | CUTANEOUS | Status: DC | PRN
  Administered 2018-04-18: 1 via RECTAL
  Filled 2018-04-17: qty 28

## 2018-04-17 MED ORDER — TETANUS-DIPHTH-ACELL PERTUSSIS 5-2.5-18.5 LF-MCG/0.5 IM SUSP
0.5000 mL | Freq: Once | INTRAMUSCULAR | Status: AC
  Administered 2018-04-19: 12:00:00 0.5 mL via INTRAMUSCULAR
  Filled 2018-04-17: qty 0.5

## 2018-04-17 MED ORDER — SODIUM CHLORIDE (PF) 0.9 % IJ SOLN
INTRAMUSCULAR | Status: DC | PRN
  Administered 2018-04-17: 14 mL/h via EPIDURAL

## 2018-04-17 MED ORDER — SODIUM CHLORIDE 0.9 % IV SOLN
3.0000 g | INTRAVENOUS | Status: AC
  Administered 2018-04-17: 3 g via INTRAVENOUS
  Filled 2018-04-17: qty 3

## 2018-04-17 MED ORDER — DIPHENHYDRAMINE HCL 25 MG PO CAPS: 25.0000 mg | ORAL_CAPSULE | Freq: Four times a day (QID) | ORAL | Status: DC | PRN

## 2018-04-17 MED ORDER — ONDANSETRON HCL 4 MG/2ML IJ SOLN: 4.0000 mg | Freq: Four times a day (QID) | INTRAMUSCULAR | Status: DC | PRN

## 2018-04-17 MED ORDER — MISOPROSTOL 25 MCG QUARTER TABLET
25.0000 ug | ORAL_TABLET | ORAL | Status: DC | PRN
  Administered 2018-04-17 (×2): 25 ug via VAGINAL
  Filled 2018-04-17 (×2): qty 1

## 2018-04-17 MED ORDER — FENTANYL CITRATE (PF) 100 MCG/2ML IJ SOLN
50.0000 ug | INTRAMUSCULAR | Status: DC | PRN
  Administered 2018-04-17: 07:00:00 100 ug via INTRAVENOUS
  Administered 2018-04-17: 50 ug via INTRAVENOUS
  Administered 2018-04-17: 100 ug via INTRAVENOUS
  Filled 2018-04-17 (×3): qty 2

## 2018-04-17 MED ORDER — ACETAMINOPHEN 325 MG PO TABS
650.0000 mg | ORAL_TABLET | ORAL | Status: DC | PRN
  Administered 2018-04-18 – 2018-04-19 (×6): 650 mg via ORAL
  Filled 2018-04-17 (×6): qty 2

## 2018-04-17 MED ORDER — SOD CITRATE-CITRIC ACID 500-334 MG/5ML PO SOLN: 30.0000 mL | ORAL | Status: DC | PRN

## 2018-04-17 MED ORDER — EPHEDRINE 5 MG/ML INJ: 10.0000 mg | INTRAVENOUS | Status: DC | PRN

## 2018-04-17 MED ORDER — PHENYLEPHRINE 40 MCG/ML (10ML) SYRINGE FOR IV PUSH (FOR BLOOD PRESSURE SUPPORT): 80.0000 ug | PREFILLED_SYRINGE | INTRAVENOUS | Status: DC | PRN

## 2018-04-17 MED ORDER — MISOPROSTOL 200 MCG PO TABS
ORAL_TABLET | ORAL | Status: AC
  Filled 2018-04-17: qty 5

## 2018-04-17 MED ORDER — ZOLPIDEM TARTRATE 5 MG PO TABS: 5.0000 mg | ORAL_TABLET | Freq: Every evening | ORAL | Status: DC | PRN

## 2018-04-17 MED ORDER — DIPHENHYDRAMINE HCL 50 MG/ML IJ SOLN: 12.5000 mg | INTRAMUSCULAR | Status: DC | PRN

## 2018-04-17 MED ORDER — PRENATAL MULTIVITAMIN CH
1.0000 | ORAL_TABLET | Freq: Every day | ORAL | Status: DC
  Administered 2018-04-18 – 2018-04-19 (×2): 1 via ORAL
  Filled 2018-04-17 (×2): qty 1

## 2018-04-17 MED ORDER — LIDOCAINE HCL (PF) 1 % IJ SOLN
30.0000 mL | INTRAMUSCULAR | Status: DC | PRN
  Filled 2018-04-17: qty 30

## 2018-04-17 MED ORDER — OXYTOCIN 40 UNITS IN NORMAL SALINE INFUSION - SIMPLE MED: 2.5000 [IU]/h | INTRAVENOUS | Status: DC

## 2018-04-17 MED ORDER — OXYTOCIN 40 UNITS IN NORMAL SALINE INFUSION - SIMPLE MED
1.0000 m[IU]/min | INTRAVENOUS | Status: DC
  Filled 2018-04-17: qty 1000

## 2018-04-17 MED ORDER — FLEET ENEMA 7-19 GM/118ML RE ENEM: 1.0000 | ENEMA | RECTAL | Status: DC | PRN

## 2018-04-17 MED ORDER — SENNOSIDES-DOCUSATE SODIUM 8.6-50 MG PO TABS
2.0000 | ORAL_TABLET | ORAL | Status: DC
  Administered 2018-04-18 – 2018-04-19 (×2): 2 via ORAL
  Filled 2018-04-17 (×2): qty 2

## 2018-04-17 MED ORDER — ONDANSETRON HCL 4 MG/2ML IJ SOLN: 4.0000 mg | INTRAMUSCULAR | Status: DC | PRN

## 2018-04-17 MED ORDER — IBUPROFEN 600 MG PO TABS
600.0000 mg | ORAL_TABLET | Freq: Four times a day (QID) | ORAL | Status: DC
  Administered 2018-04-18 – 2018-04-19 (×7): 600 mg via ORAL
  Filled 2018-04-17 (×7): qty 1

## 2018-04-17 NOTE — Progress Notes (Signed)
Michele Norman is a 38 y.o. G4P0030 at [redacted]w[redacted]d by LMP admitted for induction of labor due to polyhydramnios.  Subjective: Patient feeling more pressure  Objective: BP 139/80   Pulse (!) 104   Temp 98 F (36.7 C) (Oral)   Resp 18   Ht 5\' 2"  (1.575 m)   Wt 72.4 kg   LMP 07/16/2017   SpO2 99%   BMI 29.21 kg/m  No intake/output data recorded. Total I/O In: -  Out: 11 [Urine:650]  FHT:  FHR: 130 bpm, variability: moderate,  accelerations:  Present,  decelerations:  Absent UC:   regular, every 2 minutes SVE:   Dilation: Lip/rim Effacement (%): 90 Station: Plus 2 Exam by:: Dr Alwyn Pea Moderate amount of bloody show  Labs: Lab Results  Component Value Date   WBC 7.5 04/17/2018   HGB 10.5 (L) 04/17/2018   HCT 33.9 (L) 04/17/2018   MCV 80.5 04/17/2018   PLT 172 04/17/2018    Assessment / Plan: Induction of labor due to polyhydramnios,  progressing well. Patient almost in stage II of labor. Moderate amount of bloody show will monitor  Labor: Progressing normally Preeclampsia:  no signs or symptoms of toxicity Fetal Wellbeing:  Category I Pain Control:  Epidural I/D:  n/a Anticipated MOD:  NSVD  Michele Norman STACIA 04/17/2018, 4:41 PM

## 2018-04-17 NOTE — Progress Notes (Addendum)
Subjective: Postpartum Day # 1 : S/P NSVD due to IOL for polyhydramnios. Patient up ad lib, denies syncope or dizziness. Reports consuming regular diet without issues and denies N/V. Patient reports 0 bowel movement + passing flatus.  Denies issues with urination and reports bleeding is "lighter."  Patient is breastfeeding and reports will see lactation.  Desires nothing for postpartum contraception.  Pain is being appropriately managed with use of po meds. Pt c/o cramps and body aches, pt stated she was cold, RN to bring pt blankets.   1st and right labial laceration Feeding:  breast Contraceptive plan:  declined BB: Circ out pt desired  Objective: Vital signs in last 24 hours: Patient Vitals for the past 24 hrs:  BP Temp Temp src Pulse Resp SpO2  04/18/18 0533 103/62 98.5 F (36.9 C) Oral - 16 100 %  04/18/18 0050 102/70 98.5 F (36.9 C) Oral 86 - 100 %  04/17/18 2343 120/66 98.9 F (37.2 C) Oral 67 18 100 %  04/17/18 2245 122/74 - - 72 - -  04/17/18 2230 (!) 122/45 - - 68 - -  04/17/18 2215 111/66 - - 73 - -  04/17/18 2200 115/66 - - (!) 134 - -  04/17/18 2145 (!) 123/53 - - 97 - -  04/17/18 2135 134/71 - - (!) 129 - -  04/17/18 2102 123/65 - - 72 - -  04/17/18 2030 120/85 - - - - -  04/17/18 2000 (!) 109/53 (!) 101.3 F (38.5 C) Axillary 73 - -  04/17/18 1930 (!) 110/49 - - 77 - -  04/17/18 1900 (!) 132/95 - - (!) 121 20 -  04/17/18 1832 130/75 99.8 F (37.7 C) Oral 91 - -  04/17/18 1800 (!) 109/52 - - 83 - -  04/17/18 1754 - - - - 16 -  04/17/18 1730 123/74 - - 89 - -  04/17/18 1701 137/87 99.3 F (37.4 C) - 85 - -  04/17/18 1631 139/80 - - (!) 104 18 -  04/17/18 1603 133/83 - - (!) 137 - -  04/17/18 1530 119/83 - - 85 - -  04/17/18 1506 - - - - 18 -  04/17/18 1500 121/75 - - 80 - -  04/17/18 1430 119/72 98 F (36.7 C) Oral 80 18 -  04/17/18 1401 114/60 - - 75 - -  04/17/18 1355 - - - - 16 -  04/17/18 1330 112/61 - - 80 - -  04/17/18 1300 120/81 - - 100 16 -   04/17/18 1230 128/80 98.2 F (36.8 C) Oral 76 18 -  04/17/18 1205 (!) 113/59 - - (!) 292 16 -  04/17/18 1135 122/75 - - 84 18 99 %  04/17/18 1130 119/74 - - 82 - 99 %  04/17/18 1126 116/70 - - 78 - 99 %  04/17/18 1121 107/76 - - (!) 124 - 99 %  04/17/18 1116 112/76 - - 88 - 100 %  04/17/18 1113 (!) 115/57 - - 97 - 100 %  04/17/18 1111 (!) 115/57 - - 97 - 100 %  04/17/18 1108 (!) 95/38 - - (!) 143 - -  04/17/18 1049 124/80 - - 93 - -  04/17/18 1048 - 98.3 F (36.8 C) Oral - - -  04/17/18 0941 135/75 - - 100 20 -  04/17/18 0822 126/68 - - 65 18 -  04/17/18 0732 129/77 98.2 F (36.8 C) Oral 80 20 -  04/17/18 0712 124/81 - - 82 - -  Physical Exam:  General: alert, cooperative, appears stated age and no distress Mood/Affect: Fatigued/Anxious Lungs: clear to auscultation, no wheezes, rales or rhonchi, symmetric air entry.  Heart: normal rate, regular rhythm, normal S1, S2, no murmurs, rubs, clicks or gallops. Breast: breasts appear normal, no suspicious masses, no skin or nipple changes or axillary nodes. Abdomen:  + bowel sounds, soft, non-tender GU: perineum approximate, healing well. No signs of external hematomas. Swelling noted  Uterine Fundus: firm Lochia: appropriate Skin: Warm, Dry. DVT Evaluation: No evidence of DVT seen on physical exam. Negative Homan's sign. No cords or calf tenderness. 1+ pitting edema to lower extremities Hand edematous as well.   CBC Latest Ref Rng & Units 04/17/2018 10/07/2014 11/10/2013  WBC 4.0 - 10.5 K/uL 7.5 - 4.1  Hemoglobin 12.0 - 15.0 g/dL 10.5(L) 14.1 14.3  Hematocrit 36.0 - 46.0 % 33.9(L) - 42.5  Platelets 150 - 400 K/uL 172 - 153    No results found for this or any previous visit (from the past 24 hour(s)).   CBG (last 3)  No results for input(s): GLUCAP in the last 72 hours.   I/O last 3 completed shifts: In: -  Out: 650 [Urine:650]   Assessment Postpartum Day # 1 : S/P NSVD due to IOL for polyhydramnios. Pt stable. -2  involution. breastfeeding. Hemodynamically stable.   Plan: Continue other mgmt as ordered VTE prophylactics: Early ambulated as tolerates.  Supected chorio: s/p x1 dose unasyn, placenta to path, afebrile PP.  Pain control: Motrin/Tylenol PRN Education given regarding options for contraception, including barrier methods, injectable contraception, IUD placement, oral contraceptives.  Breastfeeding and Lactation consult   Dr. Alesia Richards to be updated on patient status @ 0700 report.  [facility] NP-C, CNM 04/18/2018, 6:52 AM   Attestation of Attending Supervision of Advanced Practitioner (CNM/NP): Evaluation and management procedures were performed by the Advanced Practitioner under my supervision and collaboration.  I have reviewed the Advanced Practitioner's note and chart, and I agree with the management and plan per NP-C, [place].    I reviewed labs that showed: CBC Latest Ref Rng & Units 04/18/2018 04/17/2018 10/07/2014  WBC 4.0 - 10.5 K/uL 15.0(H) 7.5 -  Hemoglobin 12.0 - 15.0 g/dL 9.0(L) 10.5(L) 14.1  Hematocrit 36.0 - 46.0 % 27.5(L) 33.9(L) -  Platelets 150 - 400 K/uL 128(L) 172 -   Patient to continue with her prenatal vitamin tablet that has iron.  Dr. Alesia Richards.

## 2018-04-17 NOTE — Anesthesia Preprocedure Evaluation (Signed)
Anesthesia Evaluation  Patient identified by MRN, date of birth, ID band Patient awake    Reviewed: Allergy & Precautions, H&P , NPO status , Patient's Chart, lab work & pertinent test results, reviewed documented beta blocker date and time   Airway Mallampati: II  TM Distance: >3 FB Neck ROM: full    Dental no notable dental hx.    Pulmonary neg pulmonary ROS,    Pulmonary exam normal breath sounds clear to auscultation       Cardiovascular negative cardio ROS Normal cardiovascular exam Rhythm:regular Rate:Normal     Neuro/Psych negative neurological ROS  negative psych ROS   GI/Hepatic negative GI ROS, Neg liver ROS,   Endo/Other  negative endocrine ROS  Renal/GU negative Renal ROS  negative genitourinary   Musculoskeletal   Abdominal   Peds  Hematology  (+) Blood dyscrasia, anemia ,   Anesthesia Other Findings   Reproductive/Obstetrics (+) Pregnancy                             Anesthesia Physical Anesthesia Plan  ASA: II  Anesthesia Plan: Epidural   Post-op Pain Management:    Induction:   PONV Risk Score and Plan: 2 and Treatment may vary due to age or medical condition  Airway Management Planned: Nasal Cannula  Additional Equipment:   Intra-op Plan:   Post-operative Plan:   Informed Consent: I have reviewed the patients History and Physical, chart, labs and discussed the procedure including the risks, benefits and alternatives for the proposed anesthesia with the patient or authorized representative who has indicated his/her understanding and acceptance.     Dental Advisory Given  Plan Discussed with: Anesthesiologist  Anesthesia Plan Comments: (Labs checked- platelets confirmed with RN in room. Fetal heart tracing, per RN, reported to be stable enough for sitting procedure. Discussed epidural, and patient consents to the procedure:  included risk of possible  headache,backache, failed block, allergic reaction, and nerve injury. This patient was asked if she had any questions or concerns before the procedure started.)        Anesthesia Quick Evaluation

## 2018-04-17 NOTE — Consult Note (Signed)
Neonatology Note:  Attendance at Code Apgar:   Our team responded to a Code Apgar call to room # 2S11 following NSVD, due to infant with apnea. The requesting physician was Dr. Duanne Limerick. The mother is a G4P0030 , GBS neg though concerns for chorioamnionitis just PTD (Temp to 38.5 with Unasyn 1-2 hours PTD). Pregnancy complicated by polyhydramnios.  Amnio 11/22/17, FISH, afp, chromosome microarray 70, XY, fetal CF and Alpha thal neg.  ROM occurred 11 hours PTD and the fluid was clear.  At delivery, the baby had tight nuchal and was apneic. HR ~160s.  The OB nursing staff in attendance gave vigorous stimulation and a Code Apgar was called. Our team arrived at ~1-2 minutes of life, at which time the baby was improving. HR >100. SaO2 in 70s.  BBO2 given with good response.  Audible mucous obstruction in oropharynx.  Bulb suctioned then deep suctioned mouth and both nares and moderate amount thick mucous removed. Much improved breathing effort.  BBO2 removed and SaO2 stable wnl. Ap 3/9.  Overall, now well appearing infant.  I spoke with the parents in the DR, then transferred the baby to the Pediatrician's care. D/w Nursery nurse, per [facility] Sepsis score, q4 hour vitals recommended for 24 hours.   Please do not hesitate in contacting us if further concerns.   Monia Sabal Katherina Mires, MD Neonatologist 04/17/2018, 10:28 PM

## 2018-04-17 NOTE — H&P (Signed)
Michele Norman is a 38 y.o. female presenting for induction of labor for polyhydramnios.  OB History    Gravida  4   Para      Term      Preterm      AB  3   Living  0     SAB  2   TAB  1   Ectopic      Multiple      Live Births             Past Medical History:  Diagnosis Date  . Alpha thalassemia trait   . Depression   . Fibroid   . History of abnormal cervical Pap smear   . HSV-1 (herpes simplex virus 1) infection   . Insomnia   . Ovarian cyst   . Soy protein intolerance   . Uterus, septate   . Vaginal Pap smear, abnormal    Past Surgical History:  Procedure Laterality Date  . DILATION AND CURETTAGE OF UTERUS    . HYSTEROSCOPY N/A 10/07/2014   Procedure: HYSTEROSCOPY WITH metroplasty;  Surgeon: Governor Specking, MD;  Location: [facility];  Service: Gynecology;  Laterality: N/A;  . WISDOM TOOTH EXTRACTION  Feb 2016   Family History: family history includes Heart murmur in her brother; Sickle cell anemia in her brother; Stroke in her maternal grandmother; Thyroid disease in her father. Social History:  reports that she has never smoked. She has never used smokeless tobacco. She reports that she does not drink alcohol or use drugs.     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  Review of Systems  All other systems reviewed and are negative.  Maternal Medical History:  Fetal activity: Perceived fetal activity is normal.   Last perceived fetal movement was within the past hour.    Prenatal complications: Polyhydramnios.    Prenatal Complications - Diabetes: none.    Dilation: Closed Effacement (%): 50 Station: -3 Exam by:: Jack Quarto, RN  Vitals:   04/17/18 0031 04/17/18 0450 04/17/18 0712 04/17/18 0732  BP: 120/72 104/60 124/81 129/77  Pulse: 96 86 82 80  Resp:  18  20  Temp: 98.4 F (36.9 C)   98.2 F (36.8 C)  TempSrc: Axillary   Oral  Weight:      Height:        Maternal Exam:  Abdomen: Surgical scars: low transverse.   Fundal height is Size=dates.   Estimated fetal weight is 8lbs.   Fetal presentation: vertex  Introitus: Normal vulva. Normal vagina.  Amniotic fluid character: not assessed.  Pelvis: adequate for delivery.   Cervix: Cervix evaluated by digital exam.     Fetal Exam Fetal Monitor Review: Mode: ultrasound.   Baseline rate: 120s.  Variability: moderate (6-25 bpm).   Pattern: no decelerations and no accelerations.    Fetal State Assessment: Category I - tracings are normal.     Physical Exam  Nursing note and vitals reviewed. Constitutional: She is oriented to person, place, and time. She appears well-developed and well-nourished.  HENT:  Head: Normocephalic and atraumatic.  Eyes: Pupils are equal, round, and reactive to light.  Cardiovascular: Normal rate, regular  rhythm and normal heart sounds.  Respiratory: Effort normal and breath sounds normal. No respiratory distress.  GI: There is no abdominal tenderness.  Genitourinary:    Vulva, vagina and uterus normal.     Genitourinary Comments: No internal or external lesions on exam, patient denied prodromal symptoms    Musculoskeletal: Normal range of motion.  Neurological: She is alert and oriented to person, place, and time.  Skin: Skin is warm and dry.  Psychiatric: She has a normal mood and affect. Her behavior is normal. Judgment and thought content normal.    Prenatal labs: ABO, Rh: --/--/B POS, B POS Performed at [facility], [place] [address] [place], [place] [address]  [phone]  [phone]) Antibody: NEG (04/08 0039) Rubella: Immune (09/05 0000) RPR: Nonreactive (09/05 0000)  HBsAg: Negative (09/05 0000)  HIV: Non-reactive (09/05 0000)  GBS: Negative (03/17 0000)   Assessment/Plan: 38 y.o. G4P0 at [redacted]w[redacted]d Polyhydramnios HSV 1 Arcuate uterus, s/p repair  Admit to L&D Induction of labor for polyhydramnios  Anticipate NSVD    Marikay Alar 04/17/2018, 7:48 AM

## 2018-04-17 NOTE — Progress Notes (Signed)
Pt reports feeling a "pop" and then fluid coming out.  Clear fluid noted on linen by this nurse

## 2018-04-17 NOTE — Anesthesia Procedure Notes (Signed)
Epidural Patient location during procedure: OB Start time: 04/17/2018 11:06 AM End time: 04/17/2018 11:10 AM  Staffing Anesthesiologist: Janeece Riggers, MD  Preanesthetic Checklist Completed: patient identified, site marked, surgical consent, pre-op evaluation, timeout performed, IV checked, risks and benefits discussed and monitors and equipment checked  Epidural Patient position: sitting Prep: site prepped and draped and DuraPrep Patient monitoring: continuous pulse ox and blood pressure Approach: midline Location: L3-L4 Injection technique: LOR air  Needle:  Needle type: Tuohy  Needle gauge: 17 G Needle length: 9 cm and 9 Needle insertion depth: 6 cm Catheter type: closed end flexible Catheter size: 19 Gauge Catheter at skin depth: 11 cm Test dose: negative  Assessment Events: blood not aspirated, injection not painful, no injection resistance, negative IV test and no paresthesia

## 2018-04-17 NOTE — Progress Notes (Signed)
Vitals and tracing reviewed [facility] will evaluate and if no change, IUPC and pitocin to be considered

## 2018-04-18 LAB — CBC
HCT: 27.5 % — ABNORMAL LOW (ref 36.0–46.0)
Hemoglobin: 9 g/dL — ABNORMAL LOW (ref 12.0–15.0)
MCH: 25.9 pg — ABNORMAL LOW (ref 26.0–34.0)
MCHC: 32.7 g/dL (ref 30.0–36.0)
MCV: 79.3 fL — ABNORMAL LOW (ref 80.0–100.0)
Platelets: 128 10*3/uL — ABNORMAL LOW (ref 150–400)
RBC: 3.47 MIL/uL — ABNORMAL LOW (ref 3.87–5.11)
RDW: 17.5 % — ABNORMAL HIGH (ref 11.5–15.5)
WBC: 15 10*3/uL — ABNORMAL HIGH (ref 4.0–10.5)
nRBC: 0 % (ref 0.0–0.2)

## 2018-04-18 NOTE — Lactation Note (Signed)
This note was copied from a baby's chart. Lactation Consultation Note  Patient Name: Michele Norman SELTR'V Date: 04/18/2018 Reason for consult: Initial assessment;Term P1, 7 hour female infant. Mom's feeding choice at admission was breast and formula feeding. Per parents, infant did not latch well in L&D for  only 4 minutes. Per mom, she has DEBP at home. Per  parents, dad  had given infant 20 ml of Enfamil with iron. Discussed infant tummy size and feeding amounts that are appropriate for age.  LC discussed hand expression and mom taught back and infant given 2 ml of colostrum by spoon. Infant not interested in cuing due to being feed a  large amount of formula prior to [facility] entering the room. Mom plans to breastfeed and will wait before giving formula for future feedings.   Mom knows to breastfeed by hunger cues, 8 or more times within 24 hours. Mom knows to call Nurse or [place] if she has further questions, concerns or need assistance with latching infant to breast.  LC discussed I & O. Reviewed Baby & Me book's Breastfeeding Basics.  Mom made aware of O/P services, breastfeeding support groups, community resources, and our phone # for post-discharge questions.   Maternal Data Formula Feeding for Exclusion: Yes Reason for exclusion: Mother's choice to formula and breast feed on admission Has patient been taught Hand Expression?: Yes(infant given 2 ml of colostrum ) Does the patient have breastfeeding experience prior to this delivery?: No  Feeding Feeding Type: Bottle Fed - Formula(mother brought own formula) Nipple Type: Regular  LATCH Score                   Interventions Interventions: Breast feeding basics reviewed;Hand express  Lactation Tools Discussed/Used WIC Program: No   Consult Status Consult Status: Follow-up Date: 04/18/18 Follow-up type: In-patient    Michele Norman 04/18/2018, 4:54 AM

## 2018-04-18 NOTE — Progress Notes (Signed)
MOB was referred for history of depression/anxiety. * Referral screened out by Clinical Social Worker because none of the following criteria appear to apply: ~ History of anxiety/depression during this pregnancy, or of post-partum depression following prior delivery. No concerns noted in OB records.  ~ Diagnosis of anxiety and/or depression within last 3 years; Per chart review, MOB's depression dates back to 71.  OR * MOB's symptoms currently being treated with medication and/or therapy. Please contact the Clinical Social Worker if needs arise, by [facility] request, or if MOB scores greater than 9/yes to question 10 on [place] Postpartum Depression Screen.  Abundio Miu, [place] Worker [facility] Cell#: [phone]

## 2018-04-18 NOTE — Lactation Note (Signed)
This note was copied from a baby's chart. Lactation Consultation Note  Patient Name: Michele Norman MBWGY'K Date: 04/18/2018 Reason for consult: Follow-up assessment;Difficult latch;1st time breastfeeding;Primapara;Term  Baby is 3 hours old  As LC entered the room dad changing a large transitional stool diaper.  LC checked and no void with it. LC explained the yellow line that blue when wet.  Dad mentioned the baby took sips from the Avent bottle they brought from home  And the Enfamil powder they mixed from home with water. LC reviewed the preparation  Needed when using powder formula and recommended using the hospital formula while  In the hospital. Mom mentioned she thought the formula preparation for powder formula  Was only a recommendation. LC mentioned due it not being sterile.  LC offered to assist with latching and mom receptive/ LC provided firm support, football  Position and when mom was allowing the baby nibble onto the breast / LC offered to assist /  [facility] noted the areolas to be tender for the mom to tolerate. Areola edema noted/ indicating  shells Between feedings except when sleeping to enhance compressibility of the areola for a deeper latch  To increase comfort.  LC offered to set up the hospital grade pump and mom requested to use her own DEBP she brought  From home - [employer]. ( mom aware she will have to read the instructions for set up )  This baby does open his mouth at the attempt to feed and this LC feels the baby needs to continue Opening wide to obtain the depth at the breast. LC explained PACE feeding and the importance of  Tickling upper lip until the baby opens wide and make sure the lips are flanged.  LC feels the curved lip syringe would narrow baby's sucking pattern.   Mom also is very tired and having a lot of discomfort in her bottom making it difficult to tolerate sitting.  LC encouraged her to lay on her side.     Maternal Data Has patient been  taught Hand Expression?: Yes  Feeding Feeding Type: Breast Fed  LATCH Score Latch: Repeated attempts needed to sustain latch, nipple held in mouth throughout feeding, stimulation needed to elicit sucking reflex.  Audible Swallowing: None  Type of Nipple: Everted at rest and after stimulation  Comfort (Breast/Nipple): Filling, red/small blisters or bruises, mild/mod discomfort(tender areolas )  Hold (Positioning): Assistance needed to correctly position infant at breast and maintain latch.  LATCH Score: 5  Interventions Interventions: Breast feeding basics reviewed;Assisted with latch;Skin to skin;Breast massage;Hand express;Reverse pressure;Breast compression;Adjust position;Support pillows;Position options;Expressed milk;Shells  Lactation Tools Discussed/Used Tools: Shells(due to swollen areolas - tender per mom ) Shell Type: Inverted Pump Review: Milk Storage(plans to read the instruction booklet that came with pump )   Consult Status Consult Status: Follow-up Date: 04/19/18 Follow-up type: In-patient    [place] 04/18/2018, 3:06 PM

## 2018-04-18 NOTE — Lactation Note (Signed)
This note was copied from a baby's chart. Lactation Consultation Note  Patient Name: Michele Norman EPPIR'J Date: 04/18/2018   Mom is having extreme pain with latch, both at the bare breast and with a nipple shield, necessitating unlatching infant after just a few moments. Mom's L nipple tip is abraded. Difficulties with latching may be influenced by a couple of variables: -One possible cause is a labial frenum that  bifurcates gums and prevents flanging at the breast & bottle. Blanching of the gum is noted when the upper lip is raised.  -There also seems to be lingual involvement. "[name]" has an excellent suck on a gloved finger, easily drawing the finger to the juncture of the hard & soft palate, with excellent tongue cupping of the finger. However, he is unable to mimic this at the breast or bottle. Per maternal report, when fed with the Avent bottle from home, he was only able to get a small portion of the teat in his mouth. I used a Similac slow-flow nipple & observed that it was difficult for him to take much of the bottle teat into his mouth and there seemed to be considerable tongue-thrusting & was unable to get into a rhythmic pattern. Once the NFant (purple) slow-flow nipple was used, he did much better, getting into a good rhythm. Mom to use the NFant slow-flow nipple for the remainder of feedings. I told Mom she may have a visit from someone in the NICU to observe him bottle feed, etc. I am hoping that input from the OT/SLP will also help in figuring out how to solve maternal nipple pain issues.  Ready-to-eat Enfamil formula taken into room. Mom has had a difficult day, but is willing to begin pumping tomorrow (she understands that, ideally, Mom should express her milk whenever infant receives formula).   Matthias Hughs [facility] 04/18/2018, 7:44 PM

## 2018-04-18 NOTE — Plan of Care (Signed)
  Problem: Pain Managment: Goal: General experience of comfort will improve Note:  Patient complains that the majority of her discomfort is from her hemorrhoids. Provided patient with tucks pads, dibucaine ointment and a sitz bath. Encouraged patient to use ice packs for 12-24 hours after delivery and begin to use the sitz bath after that. Maxwell Caul, Leretha Dykes [place]

## 2018-04-18 NOTE — Lactation Note (Signed)
This note was copied from a baby's chart. Lactation Consultation Note  Patient Name: Michele Norman WIOMB'T Date: 04/18/2018  5 hour female  termed infant. LC entered room Mom and infant asleep.     Maternal Data    Feeding Feeding Type: Breast Fed  LATCH Score Latch: Repeated attempts needed to sustain latch, nipple held in mouth throughout feeding, stimulation needed to elicit sucking reflex.  Audible Swallowing: A few with stimulation  Type of Nipple: Everted at rest and after stimulation  Comfort (Breast/Nipple): Soft / non-tender  Hold (Positioning): Assistance needed to correctly position infant at breast and maintain latch.  LATCH Score: 7  Interventions Interventions: Breast feeding basics reviewed;Assisted with latch;Skin to skin;Breast compression;Adjust position  Lactation Tools Discussed/Used     Consult Status      [name] 04/18/2018, 3:12 AM

## 2018-04-18 NOTE — Anesthesia Postprocedure Evaluation (Signed)
Anesthesia Post Note  Patient: Michele Norman  Procedure(s) Performed: AN AD HOC LABOR EPIDURAL     Patient location during evaluation: Mother Baby Anesthesia Type: Epidural Level of consciousness: awake and alert Pain management: pain level controlled Vital Signs Assessment: post-procedure vital signs reviewed and stable Respiratory status: spontaneous breathing, nonlabored ventilation and respiratory function stable Cardiovascular status: stable Postop Assessment: no headache, no backache and epidural receding Anesthetic complications: no    Last Vitals:  Vitals:   04/18/18 0050 04/18/18 0533  BP: 102/70 103/62  Pulse: 86   Resp:  16  Temp: 36.9 C 36.9 C  SpO2: 100% 100%    Last Pain:  Vitals:   04/18/18 0533  TempSrc: Oral  PainSc:    Pain Goal:                   Sandrea Matte

## 2018-04-19 MED ORDER — IBUPROFEN 600 MG PO TABS: 600.0000 mg | ORAL_TABLET | Freq: Four times a day (QID) | ORAL | 0 refills | Status: AC

## 2018-04-19 NOTE — Discharge Summary (Signed)
OB Discharge Summary     Patient Name: Michele Norman DOB: 1980-05-10 MRN: [id number]  Date of admission: 04/17/2018 Delivering MD: Noralyn Pick   Date of discharge: 04/19/2018  Admitting diagnosis: pregnancy Intrauterine pregnancy: [redacted]w[redacted]d     Secondary diagnosis:  Active Problems:   Polyhydramnios affecting pregnancy in third trimester   Maternal fever affecting labor  Additional problems: none     Discharge diagnosis: Term Pregnancy Delivered                                                                                                Post partum procedures:None  Augmentation: Pitocin  Complications: None  Hospital course:  Induction of Labor With Vaginal Delivery   38 y.o. yo [id number] at [redacted]w[redacted]d was admitted to the hospital 04/17/2018 for induction of labor.  Indication for induction: polyhydraminos.  Patient had an uncomplicated labor course as follows: Membrane Rupture Time/Date: 10:30 AM ,04/17/2018   Intrapartum Procedures: Episiotomy: None [1]                                         Lacerations:  1st degree [2];Labial [10]  Patient had delivery of a Viable infant.  Information for the patient's newborn:  Maryclaire, [place]oe[place] [[id number]]  Delivery Method: Vaginal, Spontaneous(Filed from Delivery Summary)   04/17/2018  Details of delivery can be found in separate delivery note.  Patient had a routine postpartum course. Patient is discharged home 04/19/18.  Physical exam  Vitals:   04/18/18 0900 04/18/18 1245 04/18/18 2219 04/19/18 0610  BP: 108/70 103/60 (!) 114/57 116/69  Pulse:   73 69  Resp: 19 16 18 16   Temp: 98.2 F (36.8 C) 98 F (36.7 C) 98 F (36.7 C) 97.8 F (36.6 C)  TempSrc: Oral Oral  Oral  SpO2: 100% 100% 100%   Weight:      Height:       General: alert, cooperative and no distress Lochia: appropriate Uterine Fundus: firm Incision: N/A DVT Evaluation: No evidence of DVT seen on physical exam. Labs: Lab Results  Component Value Date   WBC 15.0  (H) 04/18/2018   HGB 9.0 (L) 04/18/2018   HCT 27.5 (L) 04/18/2018   MCV 79.3 (L) 04/18/2018   PLT 128 (L) 04/18/2018   CMP Latest Ref Rng & Units 04/15/2013  Glucose 70 - 99 mg/dL 159(H)  BUN 6 - 23 mg/dL 9  Creatinine 0.50 - 1.10 mg/dL 0.74  Sodium 135 - 145 mEq/L 139  Potassium 3.5 - 5.3 mEq/L 3.4(L)  Chloride 96 - 112 mEq/L 103  CO2 19 - 32 mEq/L 29  Calcium 8.4 - 10.5 mg/dL 9.1  Total Protein 6.0 - 8.3 g/dL 6.3  Total Bilirubin 0.2 - 1.2 mg/dL 0.7  Alkaline Phos 39 - 117 U/L 21(L)  AST 0 - 37 U/L 11  ALT 0 - 35 U/L 11    Discharge instruction: per After Visit Summary and "Baby and Me Booklet".  After visit meds:  Allergies as of 04/19/2018  No Known Allergies     Medication List    STOP taking these medications   CLARITIN PO     TAKE these medications   ibuprofen 600 MG tablet Commonly known as:  ADVIL,MOTRIN Take 1 tablet (600 mg total) by mouth every 6 (six) hours.   PRENATAL VITAMINS PO Take by mouth.       Diet: routine diet  Activity: Advance as tolerated. Pelvic rest for 6 weeks.   Outpatient follow up:6 weeks Follow up Appt:No future appointments. Follow up Visit:No follow-ups on file.  Postpartum contraception: Natural Family Planning  Newborn Data: Live born female  Birth Weight: 7 lb 12.3 oz (3524 g) APGAR: 3, 9  Newborn Delivery   Birth date/time:  04/17/2018 21:34:00 Delivery type:  Vaginal, Spontaneous     Baby Feeding: Bottle and Breast Disposition:home with mother   04/19/2018 Starla Link, CNM

## 2018-04-19 NOTE — Lactation Note (Signed)
This note was copied from a baby's chart. Lactation Consultation Note  Patient Name: Michele Norman MBTDH'R Date: 04/19/2018 Reason for consult: Follow-up assessment;Difficult latch;1st time breastfeeding;Primapara;Term;Infant weight loss  Baby is 8 hours old / was reported to this [place] baby status will change to Baby patient today.  As LC entered the mom feeding baby with a an Avent nipple / bottle set. LC noted the baby being able to transfer  The milk out of the bottle well and took 30 ml in 10 mins.  LC also noted the baby not opening very wide/ contributing factor baby having a shallow latch on the bottle nipple  Due to the labial tongue - tie. LC assessed oral cavity with gloved fingers after the baby finished eating and  Agreed with the previous LC's assessment of the labial tongue - tie. Baby able to stretch tongue over gum line short  Distance. Baby gaggy when [facility] tried to mover her finger back in his mouth. Baby has a strong suck.   Mom is not wearing shells LC provided her yesterday to decrease areola edema/ and soreness.  LC also recommended consistent pumping with DEBP 8-10 times a day. LC offered to set up the  DEBP hospital pump and mom declined due to the charge and she expressed she preferred to use her  Own. Mom pointed to the location of her DEBP on the bench/ its a DEBP.  Mom and dad will have to set her pump up due to the DEBP not being the hospital brand.  LC discussed supply and demand and the importance of the 1st 2 weeks to establish her milk supply.   LC also recommended using the purple rim nipple provided by the previous LC to have the baby stretch is upper lip  And help him relax his jaw in hopes of re-latching at the breast.     Maternal Data    Feeding Feeding Type: (mom feeding baby a bottle ) Nipple Type: Other(with her avent nipple from home / bottle set )  LATCH Score                   Interventions Interventions: Breast feeding  basics reviewed  Lactation Tools Discussed/Used     Consult Status Consult Status: Follow-up Date: 04/20/18 Follow-up type: In-patient    [place] 04/19/2018, 12:57 PM

## 2018-06-04 DIAGNOSIS — Z3040 Encounter for surveillance of contraceptives, unspecified: Secondary | ICD-10-CM | POA: Diagnosis not present

## 2018-06-04 DIAGNOSIS — F53 Postpartum depression: Secondary | ICD-10-CM | POA: Diagnosis not present

## 2018-06-04 DIAGNOSIS — O924 Hypogalactia: Secondary | ICD-10-CM | POA: Diagnosis not present

## 2018-08-15 DIAGNOSIS — Z113 Encounter for screening for infections with a predominantly sexual mode of transmission: Secondary | ICD-10-CM | POA: Diagnosis not present

## 2018-08-15 DIAGNOSIS — Z124 Encounter for screening for malignant neoplasm of cervix: Secondary | ICD-10-CM | POA: Diagnosis not present

## 2018-08-15 DIAGNOSIS — Z01419 Encounter for gynecological examination (general) (routine) without abnormal findings: Secondary | ICD-10-CM | POA: Diagnosis not present

## 2019-04-30 IMAGING — US US MFM OB LIMITED
1 series · 13 of 28 positions shown · non-contrast
Comparison: none

[Series 1: us mfm ob limited · 13 of 37 slices shown]
[im 2/37]
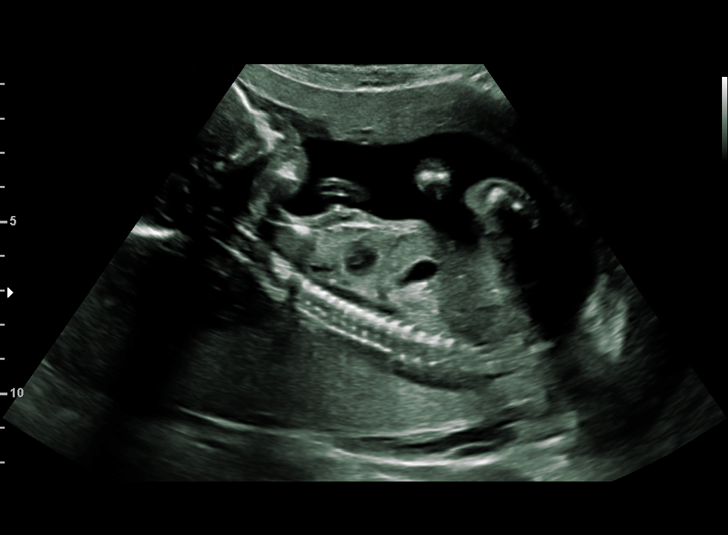
[im 5/37]
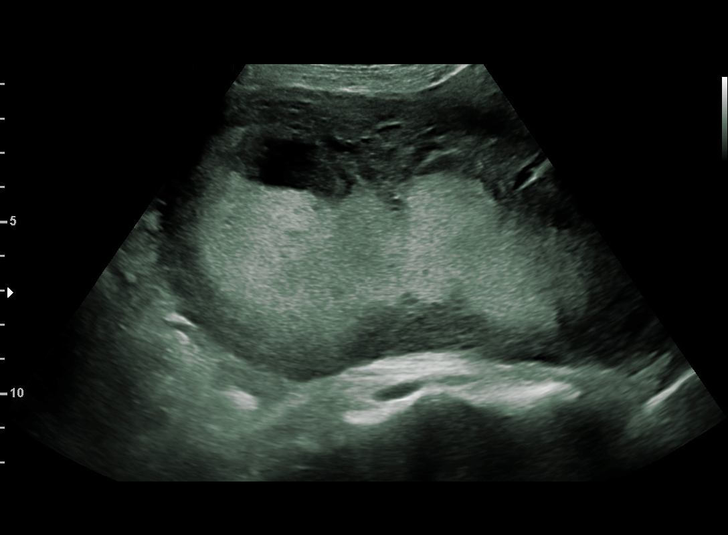
[im 7/37]
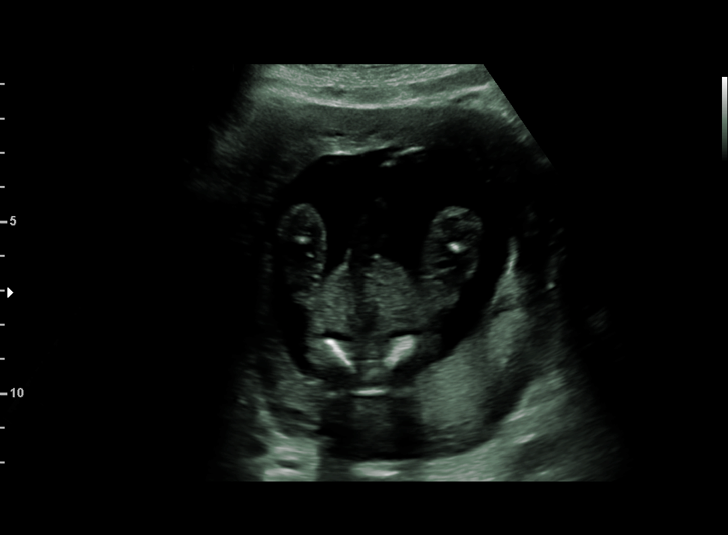
[im 10/37]
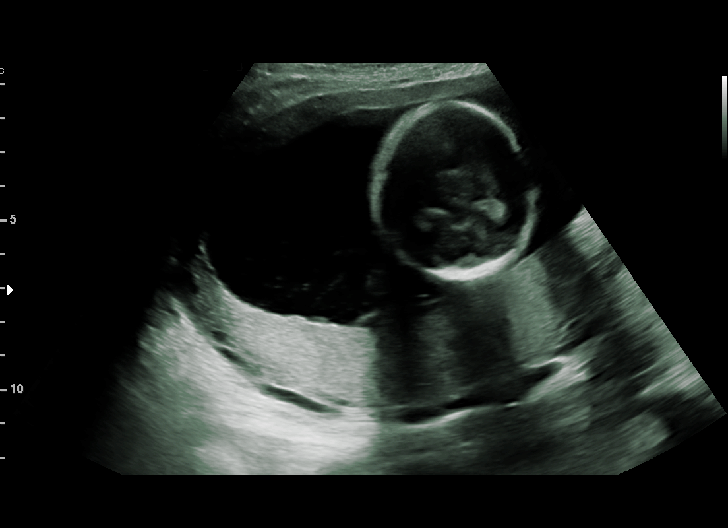
[im 13/37]
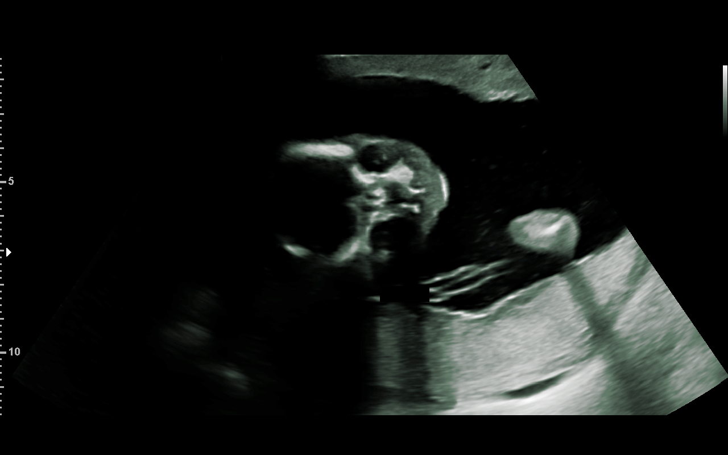
[im 15/37]
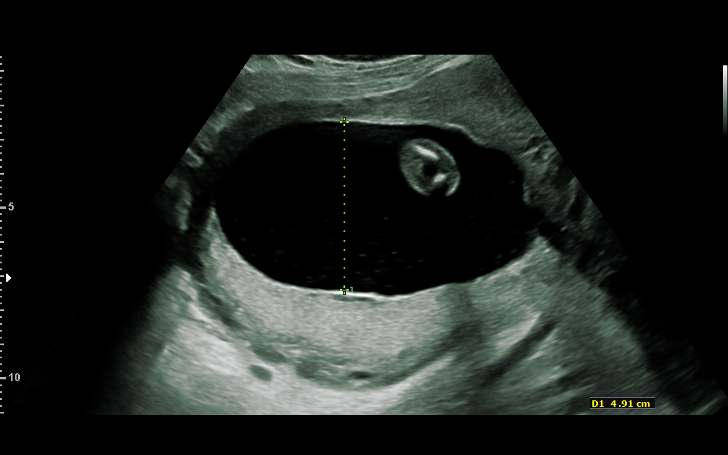
[im 19/37]
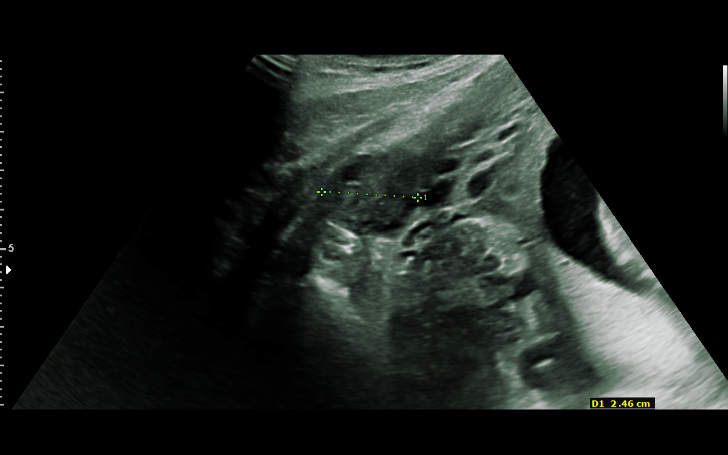
[im 22/37]
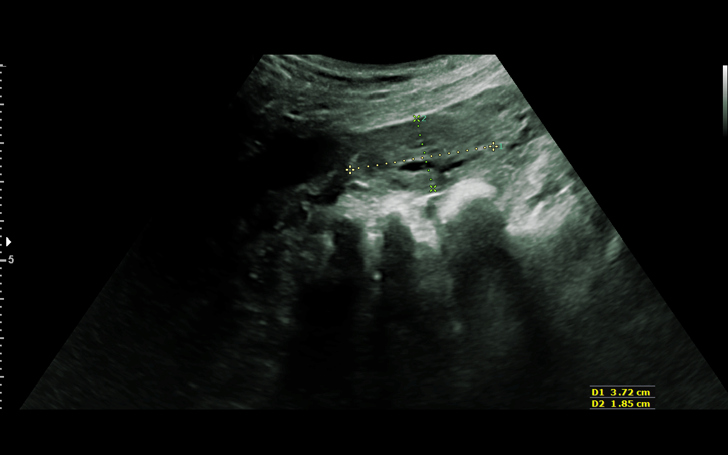
[im 25/37]
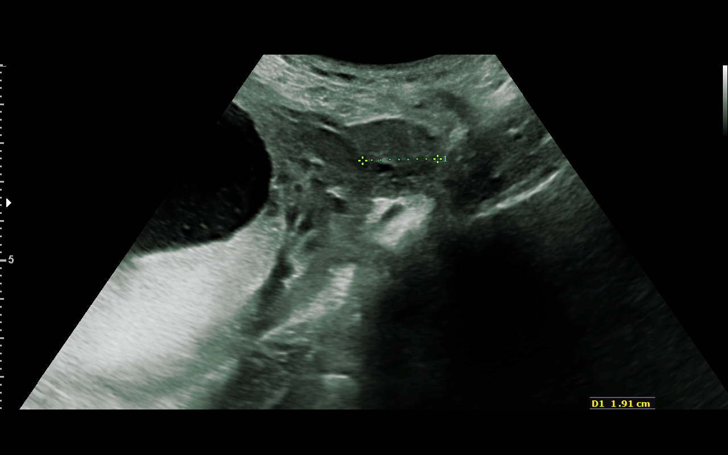
[im 27/37]
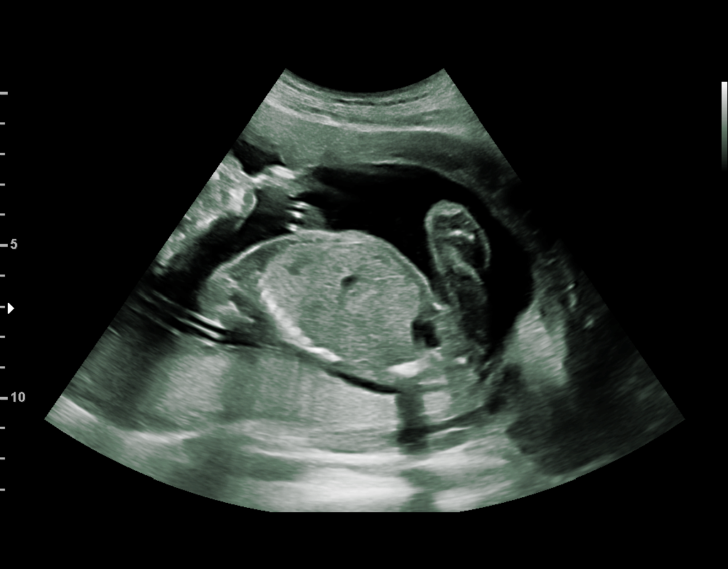
[im 30/37]
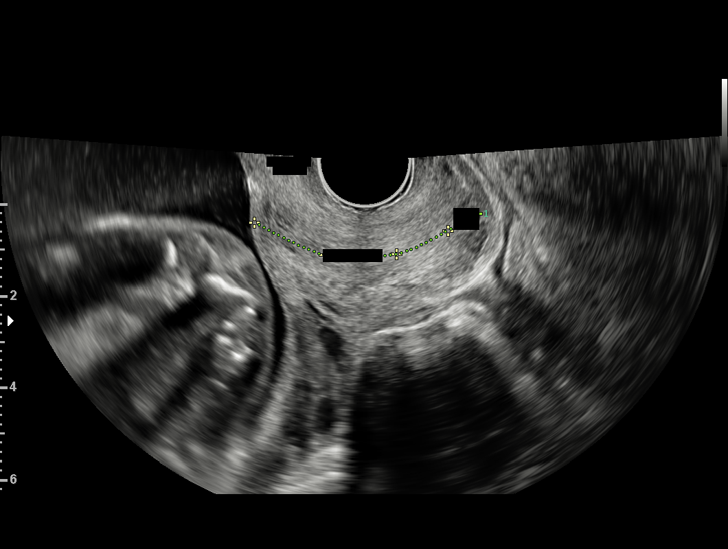
[im 33/37]
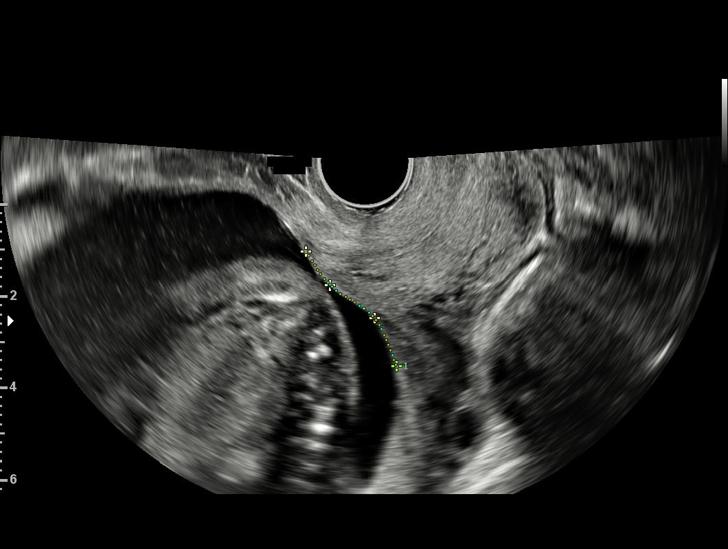
[im 35/37]
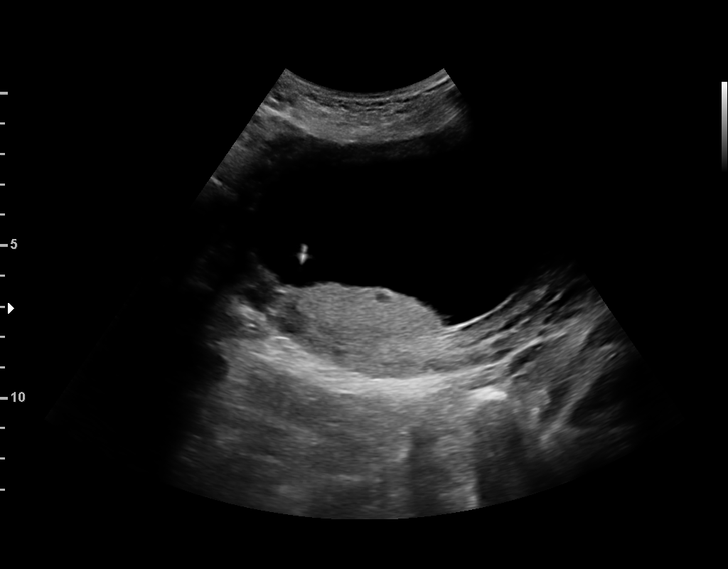

[13 of 28 positions shown; findings below may reference images not displayed]

Obstetrics &
                                                            Gynecology
                                                            [id number]
                                                            [place].

 ----------------------------------------------------------------------

 ----------------------------------------------------------------------
Indications

  Advanced maternal age primigravida 35+,
  second trimester
  Anticardiolipin antibody affecting pregnancy,
  antepartum
  Cystic Fibrosis (CF) Carrier, second trimester
  18 weeks gestation of pregnancy
 ----------------------------------------------------------------------
Fetal Evaluation

 Num Of Fetuses:         1
 Fetal Heart Rate(bpm):  151
 Cardiac Activity:       Observed
 Presentation:           Breech
 Placenta:               Posterior

 Amniotic Fluid
 AFI FV:      Within normal limits

                             Largest Pocket(cm)

OB History
 Gravidity:    4         Term:   0         SAB:   2
 TOP:          1        Living:  0
Gestational Age

 LMP:           18w 3d        Date:  07/16/17                 EDD:   04/22/18
 Best:          18w 3d     Det. By:  LMP  (07/16/17)          EDD:   04/22/18
Guided Procedures

 Type:   Amniocentesis for fetal genetic study

 FH Post Procedure:     Normal             RH Type:          B+
 Rh Immune Globulin:    Not required,      Discharge Inst.:  Post-procedure
                        Rh positive                          instructions
                                                             given
 Needle Insertions:     22 gauge x 1       Vol. Withdrawn:   30 ml of clear
                                                             amniotic fluid
 Transabdominal:        Yes

 Complications:  None
Cervix Uterus Adnexa

 Cervix
 Length:            4.8  cm.
 Normal appearance by transvaginal scan

 Left Ovary
 Within normal limits.

 Right Ovary
 Within normal limits.
Impression

 Advanced maternal age.
 Cystic fibrosis mutation carrier (positive for c.220C>T and
 c.GO0O5>A)
 Alpha thalassemia silent carrier.

 On cell-free fetal DNA screening, the risks of fetal
 aneuploidies are not increased. Patient had genetic
 counseling and MFM consultation on 11/02/17. She was
 undecided about invasive testing. Patient does not have
 sickle-cell trait. Partner reported that he will not be able to
 screen.

 Patient called our office yesterday and wanted amniocentesis
 for fetal karyotype and genetic studies. We made an
 appointment for a limited ultrasound and for counseling on
 the procedure.

 A limited ultrasound study was performed. Amniotic fluid is
 normal and good fetal activity is seen. Patient reported lower
 abdominal pressure.
 To better-assess cervical length, we performed transvaginal
 ultrasound. The cervix measures 4.8 cm, which is normal.
 We reassured the patient of the findings.

 I explained amniocentesis procedure and its possible
 complication including miscarriage (1 in 400 procedures). I
 discussed AFAFP (spina bifida), FISH and microarray
 analysis that detects some and not all genetic conditions.

 Patient opted for amniocentesis and wanted FISH and
 microarray analysis in addition to alpha-thalassemia and
 cystic fibrosis detection.

 We discussed with genetic counselor for test codes.

 After informed consent, amniocentesis was performed by Dr.
 Sane under ultrasound guidance and 30 milliliters of clear
 amniotic fluid was withdrawn. Fluid was sent for AFAFP,
 FISH and microarray analysis. Additional tests for alpha
 thalassemia and cystic fibrosis mutations were requested.
 Patient tolerated the procedure well. Post-procedure fetal
 heart rate was normal. We gave her post-procedure
 instructions.
 Maternal blood was drawn for cell-contamination studies.
 Patient's blood type: B positive.
Recommendations

 -Patient has an appointment for fetal anatomy scan at your
 office.
 -We will communicate the results to the patient and fax a
 copy to your office.
                      [place], [place]

## 2019-05-02 DIAGNOSIS — F53 Postpartum depression: Secondary | ICD-10-CM | POA: Diagnosis not present

## 2019-06-16 DIAGNOSIS — Z03818 Encounter for observation for suspected exposure to other biological agents ruled out: Secondary | ICD-10-CM | POA: Diagnosis not present

## 2019-06-16 DIAGNOSIS — Z20822 Contact with and (suspected) exposure to covid-19: Secondary | ICD-10-CM | POA: Diagnosis not present

## 2019-08-05 ENCOUNTER — Ambulatory Visit: Payer: 59 | Attending: Internal Medicine

## 2019-08-05 DIAGNOSIS — Z23 Encounter for immunization: Secondary | ICD-10-CM

## 2019-08-05 NOTE — Progress Notes (Signed)
   Covid-19 Vaccination Clinic  Name:  Michele Norman    MRN: [id number] DOB: 06-29-1980  08/05/2019  [name] was observed post Covid-19 immunization for 15 minutes without incident. She was provided with Vaccine Information Sheet and instruction to access the V-Safe system.   [name] was instructed to call 911 with any severe reactions post vaccine: Marland Kitchen Difficulty breathing  . Swelling of face and throat  . A fast heartbeat  . A bad rash all over body  . Dizziness and weakness   Immunizations Administered    Name Date Dose VIS Date Route   Pfizer COVID-19 Vaccine 08/05/2019  2:28 PM 0.3 mL 03/05/2018 Intramuscular   Manufacturer: [place]   Lot: [id number]   [place]: [address]-7

## 2019-08-26 ENCOUNTER — Ambulatory Visit: Payer: Medicaid Other | Attending: Internal Medicine

## 2019-08-26 DIAGNOSIS — Z23 Encounter for immunization: Secondary | ICD-10-CM

## 2019-08-26 NOTE — Progress Notes (Signed)
   Covid-19 Vaccination Clinic  Name:  Michele Norman    MRN: [id number] DOB: 07/06/1980  08/26/2019  [name] was observed post Covid-19 immunization for 15 minutes without incident. She was provided with Vaccine Information Sheet and instruction to access the V-Safe system.   [name] was instructed to call 911 with any severe reactions post vaccine: Marland Kitchen Difficulty breathing  . Swelling of face and throat  . A fast heartbeat  . A bad rash all over body  . Dizziness and weakness   Immunizations Administered    Name Date Dose VIS Date Route   Pfizer COVID-19 Vaccine 08/26/2019 10:32 AM 0.3 mL 03/05/2018 Intramuscular   Manufacturer: [employer]   Lot: [id number]   [place]: [id number]

## 2019-09-11 DIAGNOSIS — Z0000 Encounter for general adult medical examination without abnormal findings: Secondary | ICD-10-CM | POA: Diagnosis not present

## 2019-09-11 DIAGNOSIS — Z13220 Encounter for screening for lipoid disorders: Secondary | ICD-10-CM | POA: Diagnosis not present

## 2019-09-11 DIAGNOSIS — D72819 Decreased white blood cell count, unspecified: Secondary | ICD-10-CM | POA: Diagnosis not present

## 2019-09-24 DIAGNOSIS — D72819 Decreased white blood cell count, unspecified: Secondary | ICD-10-CM | POA: Diagnosis not present

## 2020-08-17 DIAGNOSIS — Z111 Encounter for screening for respiratory tuberculosis: Secondary | ICD-10-CM | POA: Diagnosis not present

## 2020-09-30 DIAGNOSIS — Z13220 Encounter for screening for lipoid disorders: Secondary | ICD-10-CM | POA: Diagnosis not present

## 2020-09-30 DIAGNOSIS — Z0000 Encounter for general adult medical examination without abnormal findings: Secondary | ICD-10-CM | POA: Diagnosis not present

## 2021-04-13 DIAGNOSIS — Z124 Encounter for screening for malignant neoplasm of cervix: Secondary | ICD-10-CM | POA: Diagnosis not present

## 2021-04-13 DIAGNOSIS — Z113 Encounter for screening for infections with a predominantly sexual mode of transmission: Secondary | ICD-10-CM | POA: Diagnosis not present

## 2021-04-13 DIAGNOSIS — Z01419 Encounter for gynecological examination (general) (routine) without abnormal findings: Secondary | ICD-10-CM | POA: Diagnosis not present

## 2021-05-03 DIAGNOSIS — Z3042 Encounter for surveillance of injectable contraceptive: Secondary | ICD-10-CM | POA: Diagnosis not present

## 2021-07-26 DIAGNOSIS — Z3042 Encounter for surveillance of injectable contraceptive: Secondary | ICD-10-CM | POA: Diagnosis not present

## 2021-09-02 DIAGNOSIS — N898 Other specified noninflammatory disorders of vagina: Secondary | ICD-10-CM | POA: Diagnosis not present

## 2021-09-02 DIAGNOSIS — N939 Abnormal uterine and vaginal bleeding, unspecified: Secondary | ICD-10-CM | POA: Diagnosis not present

## 2021-10-18 DIAGNOSIS — Z3042 Encounter for surveillance of injectable contraceptive: Secondary | ICD-10-CM | POA: Diagnosis not present

## 2021-11-24 DIAGNOSIS — Z13220 Encounter for screening for lipoid disorders: Secondary | ICD-10-CM | POA: Diagnosis not present

## 2021-11-24 DIAGNOSIS — Z0000 Encounter for general adult medical examination without abnormal findings: Secondary | ICD-10-CM | POA: Diagnosis not present

## 2021-12-27 DIAGNOSIS — N760 Acute vaginitis: Secondary | ICD-10-CM | POA: Diagnosis not present

## 2021-12-27 DIAGNOSIS — N898 Other specified noninflammatory disorders of vagina: Secondary | ICD-10-CM | POA: Diagnosis not present

## 2021-12-27 DIAGNOSIS — Z113 Encounter for screening for infections with a predominantly sexual mode of transmission: Secondary | ICD-10-CM | POA: Diagnosis not present

## 2021-12-27 DIAGNOSIS — B3731 Acute candidiasis of vulva and vagina: Secondary | ICD-10-CM | POA: Diagnosis not present

## 2021-12-27 DIAGNOSIS — Z141 Cystic fibrosis carrier: Secondary | ICD-10-CM | POA: Diagnosis not present

## 2022-01-04 ENCOUNTER — Other Ambulatory Visit (HOSPITAL_COMMUNITY): Payer: Self-pay

## 2022-01-04 DIAGNOSIS — Z3042 Encounter for surveillance of injectable contraceptive: Secondary | ICD-10-CM | POA: Diagnosis not present

## 2022-01-04 MED ORDER — MEDROXYPROGESTERONE ACETATE 150 MG/ML IM SUSY
150.0000 mg | PREFILLED_SYRINGE | INTRAMUSCULAR | 2 refills | Status: AC
  Filled 2022-01-04: qty 1, 90d supply, fill #0
  Filled 2022-03-29: qty 1, 90d supply, fill #1

## 2022-03-29 ENCOUNTER — Other Ambulatory Visit (HOSPITAL_COMMUNITY): Payer: Self-pay

## 2022-03-29 DIAGNOSIS — Z3042 Encounter for surveillance of injectable contraceptive: Secondary | ICD-10-CM | POA: Diagnosis not present

## 2022-04-05 ENCOUNTER — Encounter (HOSPITAL_COMMUNITY): Payer: Self-pay

## 2022-04-05 ENCOUNTER — Emergency Department (HOSPITAL_COMMUNITY)
Admission: EM | Admit: 2022-04-05 | Discharge: 2022-04-06 | Disposition: A | Discharge status: Home / Self Care | Payer: 59 | Attending: Emergency Medicine | Admitting: Emergency Medicine

## 2022-04-05 DIAGNOSIS — R0789 Other chest pain: Secondary | ICD-10-CM | POA: Insufficient documentation

## 2022-04-05 DIAGNOSIS — T1490XA Injury, unspecified, initial encounter: Secondary | ICD-10-CM | POA: Diagnosis not present

## 2022-04-05 DIAGNOSIS — S199XXA Unspecified injury of neck, initial encounter: Secondary | ICD-10-CM | POA: Diagnosis present

## 2022-04-05 DIAGNOSIS — S134XXA Sprain of ligaments of cervical spine, initial encounter: Secondary | ICD-10-CM | POA: Insufficient documentation

## 2022-04-05 DIAGNOSIS — R079 Chest pain, unspecified: Secondary | ICD-10-CM | POA: Diagnosis not present

## 2022-04-05 DIAGNOSIS — Y92410 Unspecified street and highway as the place of occurrence of the external cause: Secondary | ICD-10-CM | POA: Insufficient documentation

## 2022-04-05 DIAGNOSIS — S139XXA Sprain of joints and ligaments of unspecified parts of neck, initial encounter: Secondary | ICD-10-CM

## 2022-04-05 DIAGNOSIS — M546 Pain in thoracic spine: Secondary | ICD-10-CM | POA: Insufficient documentation
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
## 2022-04-06 ENCOUNTER — Emergency Department (HOSPITAL_COMMUNITY): Payer: 59

## 2022-04-06 DIAGNOSIS — S134XXA Sprain of ligaments of cervical spine, initial encounter: Secondary | ICD-10-CM | POA: Diagnosis not present

## 2022-04-06 DIAGNOSIS — T1490XA Injury, unspecified, initial encounter: Secondary | ICD-10-CM | POA: Diagnosis not present

## 2022-04-06 DIAGNOSIS — M546 Pain in thoracic spine: Secondary | ICD-10-CM | POA: Diagnosis not present

## 2022-04-06 DIAGNOSIS — R079 Chest pain, unspecified: Secondary | ICD-10-CM | POA: Diagnosis not present

## 2022-04-06 MED ORDER — IBUPROFEN 200 MG PO TABS
400.0000 mg | ORAL_TABLET | Freq: Once | ORAL | Status: AC
  Administered 2022-04-06: 400 mg via ORAL
  Filled 2022-04-06: qty 2
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
## 2022-04-06 NOTE — ED Notes (Signed)
Patient verbalizes understanding of discharge instructions. Opportunity for questioning and answers were provided. Armband removed by staff, pt discharged from ED. Ambulated out to lobby  

## 2022-04-06 NOTE — ED Notes (Signed)
Patient transported to CT 

## 2022-04-06 NOTE — Discharge Instructions (Signed)
Your x-rays showed no signs of a serious injury.  However, your muscles have been stretched and are sore.  This will take time to get better.  Apply ice to sore areas.  Ice should be applied for 30 minutes at a time, 4 times a day.  Take ibuprofen or naproxen as needed for pain.  If you need additional pain relief, add acetaminophen.  When you combine acetaminophen with either ibuprofen or naproxen, you get better pain relief than you get from taking either medication by itself.

## 2022-05-16 DIAGNOSIS — Z113 Encounter for screening for infections with a predominantly sexual mode of transmission: Secondary | ICD-10-CM | POA: Diagnosis not present

## 2022-05-16 DIAGNOSIS — Z124 Encounter for screening for malignant neoplasm of cervix: Secondary | ICD-10-CM | POA: Diagnosis not present

## 2022-05-16 DIAGNOSIS — Z6825 Body mass index (BMI) 25.0-25.9, adult: Secondary | ICD-10-CM | POA: Diagnosis not present

## 2022-05-16 DIAGNOSIS — Z1231 Encounter for screening mammogram for malignant neoplasm of breast: Secondary | ICD-10-CM | POA: Diagnosis not present

## 2022-05-16 DIAGNOSIS — Z01419 Encounter for gynecological examination (general) (routine) without abnormal findings: Secondary | ICD-10-CM | POA: Diagnosis not present

## 2022-05-16 DIAGNOSIS — N939 Abnormal uterine and vaginal bleeding, unspecified: Secondary | ICD-10-CM | POA: Diagnosis not present

## 2022-05-25 ENCOUNTER — Other Ambulatory Visit (HOSPITAL_COMMUNITY): Payer: Self-pay

## 2022-05-25 DIAGNOSIS — N898 Other specified noninflammatory disorders of vagina: Secondary | ICD-10-CM | POA: Diagnosis not present

## 2022-05-25 DIAGNOSIS — Z01419 Encounter for gynecological examination (general) (routine) without abnormal findings: Secondary | ICD-10-CM | POA: Diagnosis not present

## 2022-05-25 MED ORDER — BORIC ACID VAGINAL 600 MG VA SUPP
VAGINAL | 1 refills | Status: AC
  Filled 2022-05-25: qty 30, 42d supply, fill #0

## 2022-05-26 ENCOUNTER — Other Ambulatory Visit (HOSPITAL_COMMUNITY): Payer: Self-pay

## 2022-06-07 ENCOUNTER — Other Ambulatory Visit (HOSPITAL_COMMUNITY): Payer: Self-pay

## 2022-06-15 ENCOUNTER — Other Ambulatory Visit (HOSPITAL_COMMUNITY): Payer: Self-pay

## 2022-06-15 DIAGNOSIS — Z3042 Encounter for surveillance of injectable contraceptive: Secondary | ICD-10-CM | POA: Diagnosis not present

## 2022-06-15 MED ORDER — MEDROXYPROGESTERONE ACETATE 150 MG/ML IM SUSY
1.0000 mL | PREFILLED_SYRINGE | INTRAMUSCULAR | 3 refills | Status: AC
  Filled 2022-06-15: qty 1, 90d supply, fill #0
  Filled 2022-09-12: qty 1, 90d supply, fill #1

## 2022-08-16 ENCOUNTER — Other Ambulatory Visit (HOSPITAL_COMMUNITY): Payer: Self-pay

## 2022-08-16 DIAGNOSIS — N760 Acute vaginitis: Secondary | ICD-10-CM | POA: Diagnosis not present

## 2022-08-16 MED ORDER — AZO BORIC ACID 600 MG VA SUPP: VAGINAL | 1 refills | Status: AC

## 2022-09-12 ENCOUNTER — Other Ambulatory Visit (HOSPITAL_COMMUNITY): Payer: Self-pay

## 2022-09-12 DIAGNOSIS — Z3042 Encounter for surveillance of injectable contraceptive: Secondary | ICD-10-CM | POA: Diagnosis not present

## 2022-12-12 ENCOUNTER — Other Ambulatory Visit (HOSPITAL_COMMUNITY): Payer: Self-pay

## 2022-12-12 MED ORDER — MEDROXYPROGESTERONE ACETATE 150 MG/ML IM SUSY
150.0000 mg | PREFILLED_SYRINGE | INTRAMUSCULAR | 3 refills | Status: AC
  Filled 2022-12-12: qty 1, 90d supply, fill #0

## 2022-12-14 DIAGNOSIS — Z0000 Encounter for general adult medical examination without abnormal findings: Secondary | ICD-10-CM | POA: Diagnosis not present

## 2022-12-22 ENCOUNTER — Other Ambulatory Visit (HOSPITAL_COMMUNITY): Payer: Self-pay

## 2022-12-27 DIAGNOSIS — J069 Acute upper respiratory infection, unspecified: Secondary | ICD-10-CM | POA: Diagnosis not present

## 2023-01-24 DIAGNOSIS — J069 Acute upper respiratory infection, unspecified: Secondary | ICD-10-CM | POA: Diagnosis not present

## 2023-01-24 DIAGNOSIS — Z03818 Encounter for observation for suspected exposure to other biological agents ruled out: Secondary | ICD-10-CM | POA: Diagnosis not present

## 2023-07-10 DIAGNOSIS — Z01419 Encounter for gynecological examination (general) (routine) without abnormal findings: Secondary | ICD-10-CM | POA: Diagnosis not present

## 2023-07-10 DIAGNOSIS — N898 Other specified noninflammatory disorders of vagina: Secondary | ICD-10-CM | POA: Diagnosis not present

## 2023-07-10 DIAGNOSIS — N96 Recurrent pregnancy loss: Secondary | ICD-10-CM | POA: Diagnosis not present

## 2023-07-10 DIAGNOSIS — Z1339 Encounter for screening examination for other mental health and behavioral disorders: Secondary | ICD-10-CM | POA: Diagnosis not present

## 2023-07-10 DIAGNOSIS — Z1231 Encounter for screening mammogram for malignant neoplasm of breast: Secondary | ICD-10-CM | POA: Diagnosis not present

## 2023-07-10 DIAGNOSIS — Z3141 Encounter for fertility testing: Secondary | ICD-10-CM | POA: Diagnosis not present

## 2023-07-10 DIAGNOSIS — L659 Nonscarring hair loss, unspecified: Secondary | ICD-10-CM | POA: Diagnosis not present

## 2023-07-10 DIAGNOSIS — Z113 Encounter for screening for infections with a predominantly sexual mode of transmission: Secondary | ICD-10-CM | POA: Diagnosis not present

## 2023-07-11 ENCOUNTER — Other Ambulatory Visit: Payer: Self-pay | Admitting: Obstetrics and Gynecology

## 2023-07-11 ENCOUNTER — Other Ambulatory Visit (HOSPITAL_COMMUNITY): Payer: Self-pay

## 2023-07-11 DIAGNOSIS — Z3141 Encounter for fertility testing: Secondary | ICD-10-CM | POA: Diagnosis not present

## 2023-07-11 DIAGNOSIS — L659 Nonscarring hair loss, unspecified: Secondary | ICD-10-CM | POA: Diagnosis not present

## 2023-07-11 DIAGNOSIS — Z1231 Encounter for screening mammogram for malignant neoplasm of breast: Secondary | ICD-10-CM

## 2023-07-11 DIAGNOSIS — Z113 Encounter for screening for infections with a predominantly sexual mode of transmission: Secondary | ICD-10-CM | POA: Diagnosis not present

## 2023-07-11 MED ORDER — CLINDAMYCIN PHOSPHATE 2 % VA CREA
1.0000 | TOPICAL_CREAM | Freq: Every day | VAGINAL | 1 refills | Status: AC
  Filled 2023-07-11: qty 40, 7d supply, fill #0

## 2023-07-11 MED ORDER — XACIATO 2 % VA GEL
1.0000 | Freq: Every day | VAGINAL | 1 refills | Status: AC
  Filled 2023-07-11: qty 8, 30d supply, fill #0

## 2023-07-25 ENCOUNTER — Other Ambulatory Visit (HOSPITAL_COMMUNITY): Payer: Self-pay

## 2023-07-25 DIAGNOSIS — N838 Other noninflammatory disorders of ovary, fallopian tube and broad ligament: Secondary | ICD-10-CM | POA: Diagnosis not present

## 2023-07-25 DIAGNOSIS — E559 Vitamin D deficiency, unspecified: Secondary | ICD-10-CM | POA: Diagnosis not present

## 2023-07-25 DIAGNOSIS — Z3189 Encounter for other procreative management: Secondary | ICD-10-CM | POA: Diagnosis not present

## 2023-07-25 MED ORDER — OPTIMAL D3 1.25 MG (50000 UT) PO CAPS
50000.0000 [IU] | ORAL_CAPSULE | ORAL | 0 refills | Status: AC
  Filled 2023-07-25: qty 8, 56d supply, fill #0

## 2023-08-15 ENCOUNTER — Ambulatory Visit
Admission: RE | Admit: 2023-08-15 | Discharge: 2023-08-15 | Disposition: A | Discharge status: Home / Self Care | Source: Ambulatory Visit | Attending: Obstetrics and Gynecology | Admitting: Obstetrics and Gynecology

## 2023-08-15 DIAGNOSIS — Z1231 Encounter for screening mammogram for malignant neoplasm of breast: Secondary | ICD-10-CM

## 2023-09-02 ENCOUNTER — Other Ambulatory Visit (HOSPITAL_COMMUNITY): Payer: Self-pay

## 2023-09-02 ENCOUNTER — Telehealth: Admitting: Physician Assistant

## 2023-09-02 DIAGNOSIS — B029 Zoster without complications: Secondary | ICD-10-CM | POA: Diagnosis not present

## 2023-09-02 MED ORDER — VALACYCLOVIR HCL 1 G PO TABS
1000.0000 mg | ORAL_TABLET | Freq: Three times a day (TID) | ORAL | 0 refills | Status: AC
  Filled 2023-09-02: qty 21, 7d supply, fill #0

## 2023-09-02 MED ORDER — GABAPENTIN 300 MG PO CAPS
300.0000 mg | ORAL_CAPSULE | Freq: Two times a day (BID) | ORAL | 0 refills | Status: AC
  Filled 2023-09-02: qty 14, 7d supply, fill #0

## 2023-09-02 NOTE — Progress Notes (Signed)
 Virtual Visit Consent   Michele Norman, you are scheduled for a virtual visit with a [facility] provider today. Just as with appointments in the office, your consent must be obtained to participate. Your consent will be active for this visit and any virtual visit you may have with one of our providers in the next 365 days. If you have a MyChart account, a copy of this consent can be sent to you electronically.  As this is a virtual visit, video technology does not allow for your provider to perform a traditional examination. This may limit your provider's ability to fully assess your condition. If your provider identifies any concerns that need to be evaluated in person or the need to arrange testing (such as labs, EKG, etc.), we will make arrangements to do so. Although advances in technology are sophisticated, we cannot ensure that it will always work on either your end or our end. If the connection with a video visit is poor, the visit may have to be switched to a telephone visit. With either a video or telephone visit, we are not always able to ensure that we have a secure connection.  By engaging in this virtual visit, you consent to the provision of healthcare and authorize for your insurance to be billed (if applicable) for the services provided during this visit. Depending on your insurance coverage, you may receive a charge related to this service.  I need to obtain your verbal consent now. Are you willing to proceed with your visit today? Michele Norman has provided verbal consent on 09/02/2023 for a virtual visit (video or telephone). Teena Shuck, [place]  Date: 09/02/2023 11:34 AM   Virtual Visit via Video Note   I, Teena Shuck, connected with  Michele Norman  ([id number], 12/08/1980) on 09/02/23 at 11:30 AM EDT by a video-enabled telemedicine application and verified that I am speaking with the correct person using two identifiers.  Location: Patient: Virtual Visit Location Patient: Other:  Work Provider: Pharmacist, community: Home Office   I discussed the limitations of evaluation and management by telemedicine and the availability of in person appointments. The patient expressed understanding and agreed to proceed.    History of Present Illness: Michele Norman is a 43 y.o. who identifies as a female who was assigned female at birth, and is being seen today for rash.  HPI: Rash This is a new problem. The current episode started in the past 7 days. The problem has been rapidly worsening since onset. The rash is characterized by redness. Pertinent negatives include no anorexia, congestion, cough, diarrhea, eye pain, facial edema, fatigue, fever, joint pain, nail changes, rhinorrhea, shortness of breath, sore throat or vomiting. Past treatments include nothing. Her past medical history is significant for varicella.    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   Uterine leiomyoma 04/17/2018   Depressive disorder 04/17/2018   Maternal fever affecting labor 04/17/2018   Antiphospholipid syndrome (HCC) 12/25/2017   Alpha trait thalassemia 10/16/2017   Maternal age 14+, multigravida, antepartum 10/14/2017   Pregnant 09/17/2017   Uterus arcuatus 05/10/2017   Pelvic pain in female 06/07/2016   Abnormal uterine bleeding (AUB) 06/07/2016   BV (bacterial vaginosis) 06/07/2016   Insomnia 06/12/2013   Allergic rhinitis, seasonal 06/12/2013   Depressive disorder, not elsewhere classified 04/28/2013   Influenza A 04/28/2013   Missed  abortion 04/15/2013    Allergies: No Known Allergies Medications:  Current Outpatient Medications:    Boric Acid Vaginal (AZO BORIC ACID) 600 MG SUPP, PLACE IN VAGINA EVERY NIGHT FOR ONE WEEK. THEN EVERY OTHER NIGHT FOR ONE WEEK . THEN 2-3 TIME A WEEKS FOR FOUR WEEKS, Disp: 30 suppository, Rfl: 1   Cholecalciferol   (OPTIMAL D3) 1.25 MG (50000 UT) capsule, Take 1 capsule (50,000 Units total) by mouth once a week., Disp: 8 capsule, Rfl: 0   clindamycin  (CLEOCIN ) 2 % vaginal cream, Place 1 Applicatorful vaginally daily for 5 days., Disp: 40 g, Rfl: 1   Clindamycin  Phosphate (XACIATO ) 2 % GEL, Place 1 applicatorful vaginally daily for 5 days., Disp: 8 g, Rfl: 1   ibuprofen  (ADVIL ,MOTRIN ) 600 MG tablet, Take 1 tablet (600 mg total) by mouth every 6 (six) hours., Disp: 30 tablet, Rfl: 0   medroxyPROGESTERone  Acetate (DEPO-PROVERA ) 150 MG/ML SUSY, Inject 1 mL (150 mg total) into the muscle every 3 (three) months., Disp: 1 mL, Rfl: 2   medroxyPROGESTERone  Acetate (DEPO-PROVERA ) 150 MG/ML SUSY, Inject 1 mL (150 mg total) into the muscle every 3 (three) months., Disp: 1 mL, Rfl: 3   medroxyPROGESTERone  Acetate (DEPO-PROVERA ) 150 MG/ML SUSY, Inject 1 mL (150 mg total) into the muscle every 3 (three) months., Disp: 1 mL, Rfl: 3   Prenatal Multivit-Min-Fe-FA (PRENATAL VITAMINS PO), Take by mouth., Disp: , Rfl:   Observations/Objective: Patient is well-developed, well-nourished in no acute distress.  Resting comfortably  at home.  Head is normocephalic, atraumatic.  No labored breathing.  Speech is clear and coherent with logical content.  Patient is alert and oriented at baseline.  linear vesicular rash extended to right back   Assessment and Plan: There are no diagnoses linked to this encounter.  Patient presenting linear vesicular rash extended to right back consistent with shingles. Patient afebrile.  No evidence of infections including contact derm,cellulitis, atopic derm, measles, rubella. Patient prescribed Valacyclovir  and gabapentin .  Advised to monitor symptoms and record.  Supportive therapies discussed.   Follow up with primary provider in 2-3 days if symptoms continue. Report to ED if high fever, spread of rash, pain, or other concerns.  Follow Up Instructions: I discussed the assessment and treatment  plan with the patient. The patient was provided an opportunity to ask questions and all were answered. The patient agreed with the plan and demonstrated an understanding of the instructions.  A copy of instructions were sent to the patient via MyChart unless otherwise noted below.    The patient was advised to call back or seek an in-person evaluation if the symptoms worsen or if the condition fails to improve as anticipated.   .esh  Teena Shuck, PA-C

## 2023-09-02 NOTE — Patient Instructions (Signed)
 Michele Norman, thank you for joining Teena Shuck, PA-C for today's virtual visit.  While this provider is not your primary care provider (PCP), if your PCP is located in our provider database this encounter information will be shared with them immediately following your visit.   A [facility] MyChart account gives you access to today's visit and all your visits, tests, and labs performed at [facility]  click here if you don't have a [facility] MyChart account or go to [email or web address]  Consent: (Patient) Michele Norman provided verbal consent for this virtual visit at the beginning of the encounter.  Current Medications:  Current Outpatient Medications:    gabapentin  (NEURONTIN ) 300 MG capsule, Take 1 capsule (300 mg total) by mouth 2 (two) times daily for 7 days., Disp: 14 capsule, Rfl: 0   valACYclovir  (VALTREX ) 1000 MG tablet, Take 1 tablet (1,000 mg total) by mouth 3 (three) times daily for 7 days., Disp: 21 tablet, Rfl: 0   Boric Acid Vaginal (AZO BORIC ACID) 600 MG SUPP, PLACE IN VAGINA EVERY NIGHT FOR ONE WEEK. THEN EVERY OTHER NIGHT FOR ONE WEEK . THEN 2-3 TIME A WEEKS FOR FOUR WEEKS, Disp: 30 suppository, Rfl: 1   Cholecalciferol  (OPTIMAL D3) 1.25 MG (50000 UT) capsule, Take 1 capsule (50,000 Units total) by mouth once a week., Disp: 8 capsule, Rfl: 0   clindamycin  (CLEOCIN ) 2 % vaginal cream, Place 1 Applicatorful vaginally daily for 5 days., Disp: 40 g, Rfl: 1   Clindamycin  Phosphate (XACIATO ) 2 % GEL, Place 1 applicatorful vaginally daily for 5 days., Disp: 8 g, Rfl: 1   ibuprofen  (ADVIL ,MOTRIN ) 600 MG tablet, Take 1 tablet (600 mg total) by mouth every 6 (six) hours., Disp: 30 tablet, Rfl: 0   medroxyPROGESTERone  Acetate (DEPO-PROVERA ) 150 MG/ML SUSY, Inject 1 mL (150 mg total) into the muscle every 3 (three) months., Disp: 1 mL, Rfl: 2   medroxyPROGESTERone  Acetate (DEPO-PROVERA ) 150 MG/ML SUSY, Inject 1 mL (150 mg total) into the muscle every 3 (three)  months., Disp: 1 mL, Rfl: 3   medroxyPROGESTERone  Acetate (DEPO-PROVERA ) 150 MG/ML SUSY, Inject 1 mL (150 mg total) into the muscle every 3 (three) months., Disp: 1 mL, Rfl: 3   Prenatal Multivit-Min-Fe-FA (PRENATAL VITAMINS PO), Take by mouth., Disp: , Rfl:    Medications ordered in this encounter:  Meds ordered this encounter  Medications   valACYclovir  (VALTREX ) 1000 MG tablet    Sig: Take 1 tablet (1,000 mg total) by mouth 3 (three) times daily for 7 days.    Dispense:  21 tablet    Refill:  0    Supervising Provider:   LAMPTEY, PHILIP O [[id number]]   gabapentin  (NEURONTIN ) 300 MG capsule    Sig: Take 1 capsule (300 mg total) by mouth 2 (two) times daily for 7 days.    Dispense:  14 capsule    Refill:  0    Supervising Provider:   BLAISE ALEENE KIDD [[id number]]     *If you need refills on other medications prior to your next appointment, please contact your pharmacy*  Follow-Up: Call back or seek an in-person evaluation if the symptoms worsen or if the condition fails to improve as anticipated.  [facility] [phone]  Other Instructions Please report to the nearest Emergency room with any worsening symptoms. Follow up with primary care provider (PCP) in 2 -3 days.    If you have been instructed to have an in-person evaluation today at a local Urgent Care facility,  please use the link below. It will take you to a list of all of our available [facility], including address, phone number and hours of operation. Please do not delay care.  [facility]  If you or a family member do not have a primary care provider, use the link below to schedule a visit and establish care. When you choose a [facility] primary care physician or advanced practice provider, you gain a long-term partner in health. Find a Primary Care Provider  Learn more about [facility] in-office and virtual care options: [facility] - Get Care Now

## 2023-09-03 ENCOUNTER — Other Ambulatory Visit (HOSPITAL_COMMUNITY): Payer: Self-pay
